# Patient Record
Sex: Male | Born: 1960 | Race: Black or African American | Hispanic: No | Marital: Married | Smoking: Never smoker
Health system: Southern US, Community
[De-identification: ages and names within clinical notes are randomized; demographics above are authoritative.]

## PROBLEM LIST (undated history)

## (undated) DIAGNOSIS — I10 Essential (primary) hypertension: Secondary | ICD-10-CM

## (undated) DIAGNOSIS — M869 Osteomyelitis, unspecified: Secondary | ICD-10-CM

## (undated) DIAGNOSIS — E119 Type 2 diabetes mellitus without complications: Secondary | ICD-10-CM

## (undated) DIAGNOSIS — R9431 Abnormal electrocardiogram [ECG] [EKG]: Secondary | ICD-10-CM

## (undated) DIAGNOSIS — E785 Hyperlipidemia, unspecified: Secondary | ICD-10-CM

## (undated) DIAGNOSIS — R011 Cardiac murmur, unspecified: Secondary | ICD-10-CM

## (undated) HISTORY — PX: MULTIPLE TOOTH EXTRACTIONS: SHX2053

## (undated) HISTORY — DX: Hyperlipidemia, unspecified: E78.5

## (undated) HISTORY — DX: Essential (primary) hypertension: I10

## (undated) HISTORY — DX: Abnormal electrocardiogram (ECG) (EKG): R94.31

---

## 1997-11-30 ENCOUNTER — Encounter: Admission: RE | Admit: 1997-11-30 | Discharge: 1998-02-28 | Payer: Self-pay | Admitting: *Deleted

## 2003-12-13 ENCOUNTER — Encounter: Admission: RE | Admit: 2003-12-13 | Discharge: 2004-01-11 | Payer: Self-pay | Admitting: Family Medicine

## 2004-01-23 ENCOUNTER — Ambulatory Visit: Payer: Self-pay | Admitting: Family Medicine

## 2004-03-04 DIAGNOSIS — R9431 Abnormal electrocardiogram [ECG] [EKG]: Secondary | ICD-10-CM

## 2004-03-04 HISTORY — DX: Abnormal electrocardiogram (ECG) (EKG): R94.31

## 2004-04-02 ENCOUNTER — Ambulatory Visit: Payer: Self-pay | Admitting: Family Medicine

## 2004-07-18 ENCOUNTER — Ambulatory Visit: Payer: Self-pay | Admitting: Internal Medicine

## 2004-07-25 ENCOUNTER — Ambulatory Visit: Payer: Self-pay

## 2004-08-07 ENCOUNTER — Ambulatory Visit: Payer: Self-pay | Admitting: Internal Medicine

## 2006-03-27 ENCOUNTER — Ambulatory Visit: Payer: Self-pay | Admitting: Internal Medicine

## 2006-03-27 LAB — CONVERTED CEMR LAB
ALT: 15 units/L (ref 0–40)
AST: 11 units/L (ref 0–37)
Albumin: 4 g/dL (ref 3.5–5.2)
Chloride: 96 meq/L (ref 96–112)
GFR calc Af Amer: 103 mL/min
Glucose, Bld: 289 mg/dL — ABNORMAL HIGH (ref 70–99)
MCV: 83.6 fL (ref 78.0–100.0)
Microalb Creat Ratio: 8.9 mg/g (ref 0.0–30.0)
Microalb, Ur: 2.2 mg/dL — ABNORMAL HIGH (ref 0.0–1.9)
Potassium: 3.3 meq/L — ABNORMAL LOW (ref 3.5–5.1)
RBC: 6 M/uL — ABNORMAL HIGH (ref 4.22–5.81)
RDW: 11.8 % (ref 11.5–14.6)
Sodium: 136 meq/L (ref 135–145)
TSH: 3.69 microintl units/mL (ref 0.35–5.50)
Total Bilirubin: 1.8 mg/dL — ABNORMAL HIGH (ref 0.3–1.2)
WBC: 3.8 10*3/uL — ABNORMAL LOW (ref 4.5–10.5)

## 2006-04-24 ENCOUNTER — Ambulatory Visit: Payer: Self-pay | Admitting: Internal Medicine

## 2006-04-24 LAB — CONVERTED CEMR LAB
ALT: 23 units/L (ref 0–40)
AST: 18 units/L (ref 0–37)
Creatinine, Ser: 1 mg/dL (ref 0.4–1.5)

## 2006-07-23 DIAGNOSIS — E1159 Type 2 diabetes mellitus with other circulatory complications: Secondary | ICD-10-CM

## 2006-07-23 DIAGNOSIS — I10 Essential (primary) hypertension: Secondary | ICD-10-CM

## 2006-07-29 ENCOUNTER — Encounter (INDEPENDENT_AMBULATORY_CARE_PROVIDER_SITE_OTHER): Payer: Self-pay | Admitting: *Deleted

## 2006-09-01 ENCOUNTER — Encounter (INDEPENDENT_AMBULATORY_CARE_PROVIDER_SITE_OTHER): Payer: Self-pay | Admitting: *Deleted

## 2006-10-23 ENCOUNTER — Ambulatory Visit: Payer: Self-pay | Admitting: Internal Medicine

## 2006-10-23 DIAGNOSIS — R9431 Abnormal electrocardiogram [ECG] [EKG]: Secondary | ICD-10-CM

## 2006-10-23 DIAGNOSIS — E118 Type 2 diabetes mellitus with unspecified complications: Secondary | ICD-10-CM

## 2006-10-23 DIAGNOSIS — E785 Hyperlipidemia, unspecified: Secondary | ICD-10-CM

## 2006-10-23 DIAGNOSIS — E1169 Type 2 diabetes mellitus with other specified complication: Secondary | ICD-10-CM

## 2006-10-24 LAB — CONVERTED CEMR LAB
AST: 13 units/L (ref 0–37)
Chloride: 98 meq/L (ref 96–112)
Creatinine, Ser: 0.9 mg/dL (ref 0.4–1.5)
Glucose, Bld: 283 mg/dL — ABNORMAL HIGH (ref 70–99)
Potassium: 4.1 meq/L (ref 3.5–5.1)
Sodium: 139 meq/L (ref 135–145)

## 2006-11-09 ENCOUNTER — Encounter: Payer: Self-pay | Admitting: Internal Medicine

## 2006-11-11 ENCOUNTER — Encounter: Admission: RE | Admit: 2006-11-11 | Discharge: 2006-11-11 | Payer: Self-pay | Admitting: Internal Medicine

## 2006-11-11 ENCOUNTER — Encounter: Payer: Self-pay | Admitting: Internal Medicine

## 2007-02-24 ENCOUNTER — Ambulatory Visit: Payer: Self-pay | Admitting: Internal Medicine

## 2007-03-03 LAB — CONVERTED CEMR LAB
Cholesterol: 117 mg/dL (ref 0–200)
HDL: 37.6 mg/dL — ABNORMAL LOW (ref >39.0)
LDL Cholesterol: 49 mg/dL (ref 0–99)
Total CHOL/HDL Ratio: 3.1
VLDL: 30 mg/dL (ref 0–40)

## 2007-05-21 ENCOUNTER — Telehealth (INDEPENDENT_AMBULATORY_CARE_PROVIDER_SITE_OTHER): Payer: Self-pay | Admitting: *Deleted

## 2007-06-19 ENCOUNTER — Encounter (INDEPENDENT_AMBULATORY_CARE_PROVIDER_SITE_OTHER): Payer: Self-pay | Admitting: *Deleted

## 2007-07-20 ENCOUNTER — Ambulatory Visit: Payer: Self-pay | Admitting: Internal Medicine

## 2007-07-24 ENCOUNTER — Telehealth (INDEPENDENT_AMBULATORY_CARE_PROVIDER_SITE_OTHER): Payer: Self-pay | Admitting: *Deleted

## 2007-07-24 LAB — CONVERTED CEMR LAB
BUN: 14 mg/dL (ref 6–23)
CO2: 33 meq/L — ABNORMAL HIGH (ref 19–32)
Chloride: 99 meq/L (ref 96–112)
Creatinine, Ser: 1 mg/dL (ref 0.4–1.5)
Glucose, Bld: 234 mg/dL — ABNORMAL HIGH (ref 70–99)
Hgb A1c MFr Bld: 11.7 % — ABNORMAL HIGH (ref 4.6–6.0)

## 2007-08-27 ENCOUNTER — Telehealth (INDEPENDENT_AMBULATORY_CARE_PROVIDER_SITE_OTHER): Payer: Self-pay | Admitting: *Deleted

## 2007-08-28 ENCOUNTER — Encounter (INDEPENDENT_AMBULATORY_CARE_PROVIDER_SITE_OTHER): Payer: Self-pay | Admitting: *Deleted

## 2007-09-24 ENCOUNTER — Telehealth (INDEPENDENT_AMBULATORY_CARE_PROVIDER_SITE_OTHER): Payer: Self-pay | Admitting: *Deleted

## 2007-09-29 ENCOUNTER — Encounter (INDEPENDENT_AMBULATORY_CARE_PROVIDER_SITE_OTHER): Payer: Self-pay | Admitting: *Deleted

## 2007-09-30 ENCOUNTER — Telehealth (INDEPENDENT_AMBULATORY_CARE_PROVIDER_SITE_OTHER): Payer: Self-pay | Admitting: *Deleted

## 2007-10-19 ENCOUNTER — Ambulatory Visit: Payer: Self-pay | Admitting: Internal Medicine

## 2007-10-21 LAB — CONVERTED CEMR LAB: Potassium: 3.6 meq/L (ref 3.5–5.1)

## 2007-11-05 ENCOUNTER — Ambulatory Visit: Payer: Self-pay | Admitting: Internal Medicine

## 2007-11-05 DIAGNOSIS — F528 Other sexual dysfunction not due to a substance or known physiological condition: Secondary | ICD-10-CM

## 2007-11-13 ENCOUNTER — Telehealth (INDEPENDENT_AMBULATORY_CARE_PROVIDER_SITE_OTHER): Payer: Self-pay | Admitting: *Deleted

## 2007-11-16 ENCOUNTER — Telehealth (INDEPENDENT_AMBULATORY_CARE_PROVIDER_SITE_OTHER): Payer: Self-pay | Admitting: *Deleted

## 2007-12-17 ENCOUNTER — Encounter (INDEPENDENT_AMBULATORY_CARE_PROVIDER_SITE_OTHER): Payer: Self-pay | Admitting: *Deleted

## 2008-04-15 ENCOUNTER — Ambulatory Visit: Payer: Self-pay | Admitting: Internal Medicine

## 2008-08-30 ENCOUNTER — Encounter (INDEPENDENT_AMBULATORY_CARE_PROVIDER_SITE_OTHER): Payer: Self-pay | Admitting: *Deleted

## 2009-02-22 ENCOUNTER — Ambulatory Visit: Payer: Self-pay | Admitting: Internal Medicine

## 2009-02-22 LAB — HM DIABETES FOOT EXAM

## 2009-02-23 ENCOUNTER — Encounter (INDEPENDENT_AMBULATORY_CARE_PROVIDER_SITE_OTHER): Payer: Self-pay | Admitting: *Deleted

## 2009-02-23 LAB — CONVERTED CEMR LAB
AST: 17 units/L (ref 0–37)
Basophils Relative: 0.4 % (ref 0.0–3.0)
Calcium: 9.6 mg/dL (ref 8.4–10.5)
GFR calc non Af Amer: 101.56 mL/min (ref >60)
HDL: 42.7 mg/dL (ref >39.00)
Hemoglobin: 15.8 g/dL (ref 13.0–17.0)
Hgb A1c MFr Bld: 9.7 % — ABNORMAL HIGH (ref 4.6–6.5)
Lymphocytes Relative: 47.3 % — ABNORMAL HIGH (ref 12.0–46.0)
Microalb Creat Ratio: 9.8 mg/g (ref 0.0–30.0)
Monocytes Relative: 10.4 % (ref 3.0–12.0)
Neutro Abs: 1.5 10*3/uL (ref 1.4–7.7)
RBC: 5.5 M/uL (ref 4.22–5.81)
Sodium: 140 meq/L (ref 135–145)
TSH: 3.87 microintl units/mL (ref 0.35–5.50)
Total CHOL/HDL Ratio: 3
VLDL: 24.2 mg/dL (ref 0.0–40.0)

## 2009-03-02 ENCOUNTER — Ambulatory Visit: Payer: Self-pay | Admitting: Endocrinology

## 2009-07-21 ENCOUNTER — Ambulatory Visit: Payer: Self-pay | Admitting: Internal Medicine

## 2009-07-21 DIAGNOSIS — F419 Anxiety disorder, unspecified: Secondary | ICD-10-CM

## 2009-07-27 ENCOUNTER — Ambulatory Visit: Payer: Self-pay | Admitting: Endocrinology

## 2010-01-16 ENCOUNTER — Telehealth: Payer: Self-pay | Admitting: Endocrinology

## 2010-01-31 ENCOUNTER — Ambulatory Visit: Payer: Self-pay | Admitting: Endocrinology

## 2010-01-31 LAB — CONVERTED CEMR LAB: Hgb A1c MFr Bld: 11.9 % — ABNORMAL HIGH (ref 4.6–6.5)

## 2010-04-01 LAB — CONVERTED CEMR LAB
Basophils Absolute: 0 10*3/uL (ref 0.0–0.1)
Basophils Relative: 1 % (ref 0.0–3.0)
Calcium: 9.4 mg/dL (ref 8.4–10.5)
Chloride: 101 meq/L (ref 96–112)
Cholesterol: 90 mg/dL (ref 0–200)
Creatinine, Ser: 0.8 mg/dL (ref 0.4–1.5)
Creatinine,U: 228.6 mg/dL
Eosinophils Absolute: 0.1 10*3/uL (ref 0.0–0.7)
GFR calc non Af Amer: 109 mL/min
HDL: 32.1 mg/dL — ABNORMAL LOW (ref >39.0)
Hgb A1c MFr Bld: 10.1 % — ABNORMAL HIGH (ref 4.6–6.0)
MCHC: 34.8 g/dL (ref 30.0–36.0)
MCV: 83.9 fL (ref 78.0–100.0)
Neutrophils Relative %: 48.9 % (ref 43.0–77.0)
RBC: 5.29 M/uL (ref 4.22–5.81)
RDW: 11.9 % (ref 11.5–14.6)
TSH: 3.19 microintl units/mL (ref 0.35–5.50)
Triglycerides: 78 mg/dL (ref 0–149)
VLDL: 16 mg/dL (ref 0–40)

## 2010-04-03 NOTE — Assessment & Plan Note (Signed)
Summary: FU PER PT D/T---STC   Vital Signs:  Patient profile:   50 year old male Height:      71.5 inches (181.61 cm) Weight:      247.13 pounds (112.33 kg) O2 Sat:      97 % on Room air Temp:     98.6 degrees F (37.00 degrees C) oral Pulse rate:   80 / minute BP sitting:   142 / 82  (left arm) Cuff size:   large  Vitals Entered By: Josph Macho RMA (Jul 27, 2009 8:19 AM)  O2 Flow:  Room air CC: Follow-up visit/ CF Is Patient Diabetic? Yes   CC:  Follow-up visit/ CF.  History of Present Illness: pt says his elevated cbg's are being driven by "stress."  no cbg record, but states cbg's are 100's-200's.  it is in general higher as the day goes on.  he does not want to start insulin.  Current Medications (verified): 1)  Hydrochlorothiazide 25 Mg Tabs (Hydrochlorothiazide) .... Take 1 Tablet By Mouth Every Morning - Due Office Visit in April 2)  Glipizide 10 Mg Tb24 (Glipizide) .... Take 1 Tablet By Mouth Two Times A Day 3)  Vytorin 10-40 Mg Tabs (Ezetimibe-Simvastatin) .... Take 1 Tablet By Mouth Once A Day 4)  Metformin Hcl 1000 Mg Tabs (Metformin Hcl) .Marland Kitchen.. 1 1/2 By Mouth Bid 5)  Actos 45 Mg  Tabs (Pioglitazone Hcl) .Marland Kitchen.. 1 By Mouth Qd 6)  Klor-Con 10 10 Meq  Tbcr (Potassium Chloride) .Marland Kitchen.. 1 By Mouth Qd 7)  Aspirin 81 Mg Tbec (Aspirin) .Marland Kitchen.. 1 A Day 8)  Vitamin D 9)  Onetouch Ultra Test  Strp (Glucose Blood) .... Two Times A Day, and Lancets 250.02  Allergies (verified): No Known Drug Allergies  Past History:  Past Medical History: Last updated: 07/21/2009 Hypertension Diabetes mellitus, type II Hyperlipidemia 2006  abnormal EKG ( [facility])  w/ a neg stress test    Review of Systems  The patient denies hypoglycemia.    Physical Exam  General:  normal appearance.   Pulses:  dorsalis pedis intact bilat.    Extremities:  no deformity.  no ulcer on the feet.  feet are of normal color and temp.  skin on the feet is very dry. trace right pedal edema and trace left pedal  edema.   mycotic toenails.   Neurologic:  sensation is intact to touch on the feet  Additional Exam:  Hemoglobin A1C       [H]  10.6 %                      4.6-6.5 Testosterone              495.52 ng/dL     Impression & Recommendations:  Problem # 1:  DIABETES MELLITUS, TYPE II (ICD-250.00) needs increased rx  Problem # 2:  ERECTILE DYSFUNCTION (ICD-302.72) with normal testosterone  Other Orders: TLB-A1C / Hgb A1C (Glycohemoglobin) (83036-A1C) TLB-Testosterone, Total (84403-TESTO) Est. Patient Level III (86578)  Patient Instructions: 1)  check your blood sugar 2 times a day.  vary the time of day when you check, between before the 3 meals, and at bedtime.  also check if you have symptoms of your blood sugar being too high or too low.  please keep a record of the readings and bring it to your next appointment here.  please call us sooner if you are having low blood sugar episodes. 2)  blood tests are being ordered for you  today.  please call [phone] to hear your test results. 3)  pending the test results, please consider that there are several other diabetes pills which could be added to help your blood sugar, but it is unlikely that your blood sugar can be controlled unless you take insulin. 4)  Please schedule a follow-up appointment in 3 months. 5)  (update: i left message on phone-tree:  rx as we discussed)

## 2010-04-03 NOTE — Assessment & Plan Note (Signed)
Summary: 4 mth fu/ns/kdc   Vital Signs:  Patient profile:   50 year old male Height:      71.5 inches Weight:      248.2 pounds BMI:     34.26 Pulse rate:   70 / minute BP sitting:   128 / 80  Vitals Entered By: Shary Decamp (Jul 21, 2009 4:19 PM) CC: pt states he has been compliant with his meds but blood sugar still elevated. feels like it is related to stress   History of Present Illness: routine office visit  Current Medications (verified): 1)  Hydrochlorothiazide 25 Mg Tabs (Hydrochlorothiazide) .... Take 1 Tablet By Mouth Every Morning - Due Office Visit in April 2)  Glipizide 10 Mg Tb24 (Glipizide) .... Take 1 Tablet By Mouth Two Times A Day 3)  Vytorin 10-40 Mg Tabs (Ezetimibe-Simvastatin) .... Take 1 Tablet By Mouth Once A Day 4)  Metformin Hcl 1000 Mg Tabs (Metformin Hcl) .Marland Kitchen.. 1 1/2 By Mouth Bid 5)  Actos 45 Mg  Tabs (Pioglitazone Hcl) .Marland Kitchen.. 1 By Mouth Qd 6)  Klor-Con 10 10 Meq  Tbcr (Potassium Chloride) .Marland Kitchen.. 1 By Mouth Qd 7)  Aspirin 81 Mg Tbec (Aspirin) .Marland Kitchen.. 1 A Day 8)  Vitamin D 9)  Onetouch Ultra Test  Strp (Glucose Blood) .... Two Times A Day, and Lancets 250.02  Allergies (verified): No Known Drug Allergies  Past History:  Past Medical History: Hypertension Diabetes mellitus, type II Hyperlipidemia 2006  abnormal EKG ( [facility])  w/ a neg stress test    Past Surgical History: Reviewed history from 11/05/2007 and no changes required. no  Social History: Reviewed history from 02/22/2009 and no changes required. 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  Physical Exam  General:  alert and well-developed.   Psych:  Oriented X3, memory intact for recent and remote, normally interactive, good eye contact, not anxious appearing, and not depressed appearing.     Impression & Recommendations:  Problem # 1:  ANXIETY (ICD-300.00)  we spent more than 50% of the time in this 20 minute visit talking about his anxiety. His symptoms are probably better described as frustration in regards to his home finances. The patient was counseled I agree to refer him to marriage counseling  Orders: Misc. Referral (Misc. Ref)  Problem # 2:  DIABETES MELLITUS, TYPE II (ICD-250.00) per Dr. Everardo All, to see him in few days His updated medication list for this problem includes:    Glipizide 10 Mg Tb24 (Glipizide) .Marland Kitchen... Take 1 tablet by mouth two times a day    Metformin Hcl 1000 Mg Tabs (Metformin hcl) .Marland Kitchen... 1 1/2 by mouth bid    Actos 45 Mg Tabs (Pioglitazone hcl) .Marland Kitchen... 1 by mouth qd    Aspirin 81 Mg Tbec (Aspirin) .Marland Kitchen... 1 a day  Problem # 3:  HYPERTENSION (ICD-401.9) well-controlled His updated medication list for this problem includes:    Hydrochlorothiazide 25 Mg Tabs (Hydrochlorothiazide) .Marland Kitchen... Take 1 tablet by mouth every morning - due office visit  in april  BP today: 128/80 Prior BP: 137/60 (03/02/2009)  Labs Reviewed: K+: 4.2 (02/22/2009) Creat: : 1.0 (02/22/2009)   Chol: 112 (02/22/2009)   HDL: 42.70 (02/22/2009)   LDL: 45 (02/22/2009)   TG: 121.0 (02/22/2009)  Complete Medication List: 1)  Hydrochlorothiazide 25 Mg Tabs (Hydrochlorothiazide) .... Take 1 tablet by mouth every morning - due office visit in april 2)  Glipizide 10 Mg Tb24 (Glipizide) .... Take 1 tablet by mouth two times a day 3)  Vytorin 10-40 Mg Tabs (Ezetimibe-simvastatin) .... Take 1 tablet by mouth once a day 4)  Metformin Hcl 1000 Mg Tabs (Metformin hcl) .Marland Kitchen.. 1 1/2 by mouth  bid 5)  Actos 45 Mg Tabs (Pioglitazone hcl) .Marland Kitchen.. 1 by mouth qd 6)  Klor-con 10 10 Meq Tbcr (Potassium chloride) .Marland Kitchen.. 1 by mouth qd 7)  Aspirin 81 Mg Tbec (Aspirin) .Marland Kitchen.. 1 a day 8)  Vitamin D  9)  Onetouch Ultra Test Strp (Glucose blood) .... Two times a day, and lancets 250.02  Patient Instructions: 1)  Please schedule a follow-up appointment in 6 months .

## 2010-04-03 NOTE — Progress Notes (Signed)
Summary: OV due  Phone Note Outgoing Call Call back at [facility] Phone [phone] Call back at Work Phone [phone]   Call placed by: Brenton Grills CMA Duncan Dull),  January 16, 2010 1:43 PM Call placed to: Patient Summary of Call: Per MD, pt is due for F/U OV. Left message for pt to callback office  Follow-up for Phone Call        left message for pt to callback office Follow-up by: Brenton Grills CMA Duncan Dull),  January 18, 2010 11:27 AM  Additional Follow-up for Phone Call Additional follow up Details #1::        Appointment scheduled 01/31/2010 8:00am Additional Follow-up by: Brenton Grills CMA Duncan Dull),  January 22, 2010 4:24 PM

## 2010-04-03 NOTE — Assessment & Plan Note (Signed)
Summary: PER [facility] FU--STC   Vital Signs:  Patient profile:   50 year old male Height:      71.5 inches (181.61 cm) Weight:      246.25 pounds (111.93 kg) BMI:     33.99 O2 Sat:      97 % on Room air Temp:     98.8 degrees F (37.11 degrees C) oral Pulse rate:   71 / minute BP sitting:   152 / 82  (left arm) Cuff size:   large  Vitals Entered By: Brenton Grills CMA Duncan Dull) (January 31, 2010 8:11 AM)  O2 Flow:  Room air CC: Follow-up visit/aj Is Patient Diabetic? Yes   CC:  Follow-up visit/aj.  History of Present Illness: pt states he feels well in general, except for "family issues."  no cbg record, but states cbg's are 200's.  he now agrees to insulin   Current Medications (verified): 1)  Hydrochlorothiazide 25 Mg Tabs (Hydrochlorothiazide) .... Take 1 Tablet By Mouth Every Morning - Due Office Visit in April 2)  Glipizide 10 Mg Tb24 (Glipizide) .... Take 1 Tablet By Mouth Two Times A Day 3)  Vytorin 10-40 Mg Tabs (Ezetimibe-Simvastatin) .... Take 1 Tablet By Mouth Once A Day 4)  Metformin Hcl 1000 Mg Tabs (Metformin Hcl) .Marland Kitchen.. 1 1/2 By Mouth Bid 5)  Actos 45 Mg  Tabs (Pioglitazone Hcl) .Marland Kitchen.. 1 By Mouth Qd 6)  Klor-Con 10 10 Meq  Tbcr (Potassium Chloride) .Marland Kitchen.. 1 By Mouth Qd 7)  Aspirin 81 Mg Tbec (Aspirin) .Marland Kitchen.. 1 A Day 8)  Vitamin D 9)  Onetouch Ultra Test  Strp (Glucose Blood) .... 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    Physical Exam  General:  normal appearance.   Pulses:   dorsalis pedis intact bilat.    Extremities:  no deformity.  no ulcer on the feet.  feet are of normal color and temp.  there are mycotic toenails.   Neurologic:  sensation is intact to touch on the feet    Impression & Recommendations:  Problem # 1:  DIABETES MELLITUS, TYPE II (ICD-250.00) poorcontrol needs insulin  HgbA1C: 10.6 (07/27/2009)  Medications Added to Medication List This Visit: 1)  Levemir Flexpen 100 Unit/ml Soln (Insulin detemir) .Marland Kitchen.. 10 units each am, and pen needles once daily  Other Orders: Diabetic Clinic Referral (Diabetic) TLB-A1C / Hgb A1C (Glycohemoglobin) (83036-A1C) Est. Patient Level III (09323)  Patient Instructions: 1)  check your blood sugar 2 times a day.  vary the time of day when you check, between before the 3 meals, and at bedtime.  also check if you have symptoms of your blood sugar being too high or too low.  please keep a record of the readings and bring it to your next appointment here.  please call us sooner if you are having low blood sugar episodes. 2)  blood tests are being ordered for you today.  please call [phone] to hear your test results. 3)  Please schedule a follow-up appointment in 3 months. 4)  refer to a  diabetes educator to learn insulin pen. 5)  start levemir 10 units each am.  then increase by 5 units, every few days, until blood sugar at some time of day is in the low-100's. 6)  continue diabetes pills for now. 7)  return early january. call sooner if any questions. 8)  (update: i left message on phone-tree:  rx as we discussed) Prescriptions: LEVEMIR FLEXPEN 100 UNIT/ML SOLN (INSULIN DETEMIR) 10 units each am, and pen needles once daily  #1 box x 11   Entered and Authorized by:   Minus Breeding MD   Signed by:   Minus Breeding MD on 01/31/2010   Method used:   Electronically to        [facility] [phone]* (retail)       [address]       [place], [place]  [address]       Ph: [phone]       Fax: [phone]    RxID:   [id number]    Orders Added: 1)  Diabetic Clinic Referral [Diabetic] 2)  TLB-A1C / Hgb A1C (Glycohemoglobin) [83036-A1C] 3)  Est. Patient Level III [72536]

## 2010-05-22 ENCOUNTER — Other Ambulatory Visit: Payer: Self-pay | Admitting: Internal Medicine

## 2010-05-22 DIAGNOSIS — E119 Type 2 diabetes mellitus without complications: Secondary | ICD-10-CM

## 2010-05-22 MED ORDER — GLIPIZIDE 10 MG PO TABS: 10.0000 mg | ORAL_TABLET | Freq: Two times a day (BID) | ORAL | Status: DC

## 2010-05-22 NOTE — Telephone Encounter (Signed)
Rx forwarded from [facility] for diabetic medication

## 2010-06-28 ENCOUNTER — Other Ambulatory Visit: Payer: Self-pay | Admitting: Internal Medicine

## 2010-08-05 ENCOUNTER — Other Ambulatory Visit: Payer: Self-pay | Admitting: Endocrinology

## 2010-08-06 ENCOUNTER — Other Ambulatory Visit: Payer: Self-pay | Admitting: Internal Medicine

## 2010-09-13 ENCOUNTER — Other Ambulatory Visit: Payer: Self-pay | Admitting: Internal Medicine

## 2010-09-13 NOTE — Telephone Encounter (Signed)
Patient has appt scheduled for Mon 7/16 at 11:30

## 2010-09-17 ENCOUNTER — Ambulatory Visit (INDEPENDENT_AMBULATORY_CARE_PROVIDER_SITE_OTHER): Payer: BC Managed Care – PPO | Admitting: Internal Medicine

## 2010-09-17 ENCOUNTER — Encounter: Payer: Self-pay | Admitting: Internal Medicine

## 2010-09-17 DIAGNOSIS — I10 Essential (primary) hypertension: Secondary | ICD-10-CM

## 2010-09-17 DIAGNOSIS — E119 Type 2 diabetes mellitus without complications: Secondary | ICD-10-CM

## 2010-09-17 DIAGNOSIS — E785 Hyperlipidemia, unspecified: Secondary | ICD-10-CM

## 2010-09-17 NOTE — Patient Instructions (Addendum)
Please came back for labs within a week, fasting FLP---dx hyperlipidemia BMP---dx HTN You need to see Dr Everardo All ASAP for your diabetes

## 2010-09-17 NOTE — Assessment & Plan Note (Addendum)
Poor compliance, strongly rec to see Dr Everardo All. Risk of poorly controlled DM discussed, we went over the meaning of a hemoglobin A1c He has lost wt: praised (hopefully wt loss due to a better lifestyle and not due to poorly controlled diabetes)

## 2010-09-17 NOTE — Assessment & Plan Note (Signed)
Due for labs , See instructions

## 2010-09-17 NOTE — Assessment & Plan Note (Signed)
At goal , labs, restart K+ supplements if needed (stopped taking K+ a while back

## 2010-09-17 NOTE — Progress Notes (Signed)
  Subjective:    Patient ID: Troy Becker, male    DOB: 1960/08/28, 50 y.o.   MRN: [id number]  HPI ROV, last OV > 1 year ago. Feels great physically, reports he still has a lot of anxiety ( patient states related to his relationship with wife)  Past Medical History  Diagnosis Date  . Hypertension   . Diabetes mellitus   . Hyperlipidemia   . Abnormal EKG 2006    w/ a neg stress test   No past surgical history on file.   Review of Systems As far as the diabetes, has not seen Dr. Everardo All in a while, he self discontinued Actos due to the fear of bladder cancer, he was prescribed insulin but never did try. Reports that he has improved quite a bit his diet and is trying to exercise more. Denies polydipsia or polyuria. His weight has dropped from 248 to 239. Sleeps well despite anxiety. Not taking potassium, reason?     Objective:   Physical Exam  Constitutional: He appears well-developed and well-nourished. No distress.  HENT:  Head: Normocephalic and atraumatic.  Cardiovascular: Normal rate, regular rhythm and normal heart sounds.   No murmur heard. Pulmonary/Chest: Breath sounds normal. No respiratory distress. He has no wheezes. He has no rales.  Musculoskeletal: He exhibits no edema.  Skin: He is not diaphoretic.          Assessment & Plan:

## 2010-09-21 ENCOUNTER — Other Ambulatory Visit: Payer: Self-pay | Admitting: Internal Medicine

## 2010-09-21 DIAGNOSIS — I10 Essential (primary) hypertension: Secondary | ICD-10-CM

## 2010-09-21 DIAGNOSIS — E785 Hyperlipidemia, unspecified: Secondary | ICD-10-CM

## 2010-09-24 ENCOUNTER — Other Ambulatory Visit (INDEPENDENT_AMBULATORY_CARE_PROVIDER_SITE_OTHER): Payer: BC Managed Care – PPO

## 2010-09-24 DIAGNOSIS — I10 Essential (primary) hypertension: Secondary | ICD-10-CM

## 2010-09-24 DIAGNOSIS — E785 Hyperlipidemia, unspecified: Secondary | ICD-10-CM

## 2010-09-24 LAB — BASIC METABOLIC PANEL
BUN: 15 mg/dL (ref 6–23)
Calcium: 9.2 mg/dL (ref 8.4–10.5)
GFR: 123.42 mL/min (ref >60.00)
Glucose, Bld: 165 mg/dL — ABNORMAL HIGH (ref 70–99)

## 2010-09-24 LAB — LIPID PANEL
Cholesterol: 90 mg/dL (ref 0–200)
HDL: 43.1 mg/dL (ref >39.00)
LDL Cholesterol: 31 mg/dL (ref 0–99)
VLDL: 15.8 mg/dL (ref 0.0–40.0)

## 2010-09-24 NOTE — Progress Notes (Signed)
Labs only

## 2010-10-01 ENCOUNTER — Other Ambulatory Visit: Payer: Self-pay | Admitting: Internal Medicine

## 2010-10-19 ENCOUNTER — Other Ambulatory Visit: Payer: Self-pay | Admitting: Endocrinology

## 2010-12-11 ENCOUNTER — Other Ambulatory Visit: Payer: Self-pay | Admitting: Endocrinology

## 2011-03-31 ENCOUNTER — Other Ambulatory Visit: Payer: Self-pay | Admitting: Endocrinology

## 2011-05-10 ENCOUNTER — Other Ambulatory Visit: Payer: Self-pay | Admitting: Internal Medicine

## 2011-05-13 NOTE — Telephone Encounter (Signed)
Prescriptions sent to pharmacy

## 2011-07-17 ENCOUNTER — Other Ambulatory Visit: Payer: Self-pay | Admitting: Endocrinology

## 2011-07-22 ENCOUNTER — Other Ambulatory Visit: Payer: Self-pay | Admitting: *Deleted

## 2011-07-22 MED ORDER — GLIPIZIDE 10 MG PO TABS: ORAL_TABLET | ORAL | Status: DC

## 2011-07-22 MED ORDER — METFORMIN HCL 1000 MG PO TABS: ORAL_TABLET | ORAL | Status: DC

## 2011-07-22 NOTE — Telephone Encounter (Signed)
Pt has scheduled an appointment for June 3rd. He needs refills of his medication until then. Rx for Metformin and Glipizide sent to Pharmacy, pt informed.

## 2011-08-05 ENCOUNTER — Encounter: Payer: Self-pay | Admitting: Endocrinology

## 2011-08-05 ENCOUNTER — Ambulatory Visit (INDEPENDENT_AMBULATORY_CARE_PROVIDER_SITE_OTHER): Payer: BC Managed Care – PPO | Admitting: Endocrinology

## 2011-08-05 ENCOUNTER — Other Ambulatory Visit (INDEPENDENT_AMBULATORY_CARE_PROVIDER_SITE_OTHER): Payer: BC Managed Care – PPO

## 2011-08-05 VITALS — BP 142/82 | HR 76 | Temp 98.1°F | Ht 72.0 in | Wt 239.0 lb

## 2011-08-05 DIAGNOSIS — E119 Type 2 diabetes mellitus without complications: Secondary | ICD-10-CM

## 2011-08-05 DIAGNOSIS — Z79899 Other long term (current) drug therapy: Secondary | ICD-10-CM

## 2011-08-05 LAB — HEPATIC FUNCTION PANEL
ALT: 15 U/L (ref 0–53)
AST: 18 U/L (ref 0–37)
Albumin: 3.8 g/dL (ref 3.5–5.2)
Alkaline Phosphatase: 67 U/L (ref 39–117)
Total Bilirubin: 1 mg/dL (ref 0.3–1.2)

## 2011-08-05 LAB — HEMOGLOBIN A1C: Hgb A1c MFr Bld: 12.8 % — ABNORMAL HIGH (ref 4.6–6.5)

## 2011-08-05 LAB — MICROALBUMIN / CREATININE URINE RATIO: Microalb Creat Ratio: 3.7 mg/g (ref 0.0–30.0)

## 2011-08-05 MED ORDER — INSULIN ASPART PROT & ASPART (70-30 MIX) 100 UNIT/ML ~~LOC~~ SUSP: 10.0000 [IU] | Freq: Every day | SUBCUTANEOUS | Status: DC

## 2011-08-05 MED ORDER — TERBINAFINE HCL 250 MG PO TABS: 250.0000 mg | ORAL_TABLET | Freq: Every day | ORAL | Status: DC

## 2011-08-05 MED ORDER — GLUCOSE BLOOD VI STRP: 1.0000 | ORAL_STRIP | Freq: Every day | Status: DC

## 2011-08-05 NOTE — Progress Notes (Signed)
Subjective:    Patient ID: Troy Becker, male    DOB: 02/07/1961, 51 y.o.   MRN: [id number]  HPI Pt returns for f/u of type 2 DM (dx'ed 1999; no known complications).  pt states he feels well in general.  no cbg record, but states cbg's are in the 200's.    Pt states few years of slight thickening of the toenails, but no assoc pain. Past Medical History  Diagnosis Date  . Hypertension   . Diabetes mellitus   . Hyperlipidemia   . Abnormal EKG 2006    w/ a neg stress test    No past surgical history on file.  History   Social History  . Marital Status: Married    Spouse Name: N/A    Number of Children: N/A  . Years of Education: N/A   Occupational History  . Project Management (Customer service manager) sedentary     Social History Main Topics  . Smoking status: Never Smoker   . Smokeless tobacco: Not on file  . Alcohol Use: No  . Drug Use: Not on file  . Sexually Active: Not on file   Other Topics Concern  . Not on file   Social History Narrative   Exercise- was walking some until the weather turned cold Diet- have cut down on sugar and salt     Current Outpatient Prescriptions on File Prior to Visit  Medication Sig Dispense Refill  . aspirin 81 MG tablet Take 81 mg by mouth daily.        Marland Kitchen glipiZIDE (GLUCOTROL) 10 MG tablet TAKE 1 TABLET BY MOUTH TWICE DAILY  60 tablet  1  . hydrochlorothiazide (HYDRODIURIL) 25 MG tablet TAKE 1 TABLET BY MOUTH EVERY MORNING  30 tablet  4  . metFORMIN (GLUCOPHAGE) 1000 MG tablet TAKE 1 1/2 TABLET BY MOUTH TWICE DAILY  90 tablet  1  . VYTORIN 10-40 MG per tablet TAKE 1 TABLET BY MOUTH EVERY DAY  30 tablet  4  . insulin aspart protamine-insulin aspart (NOVOLOG MIX 70/30 FLEXPEN) (70-30) 100 UNIT/ML injection Inject 10 Units into the skin daily with breakfast. And pen needles 1/day  15 mL  12    No Known Allergies  Family History  Problem Relation Age of Onset  . Coronary artery disease Father     CABG 75  . Hypertension Father     . Hypertension Mother   . Hypertension Sister   . Hypertension Brother   . Colon cancer Neg Hx   . Prostate cancer Neg Hx   . Diabetes Brother     BP 142/82  Pulse 76  Temp(Src) 98.1 F (36.7 C) (Oral)  Ht 6' (1.829 m)  Wt 239 lb (108.41 kg)  BMI 32.41 kg/m2  SpO2 98%    Review of Systems He has lost a few lb, due to his efforts.  denies hypoglycemia    Objective:   Physical Exam VITAL SIGNS:  See vs page GENERAL: no distress Pulses: dorsalis pedis intact bilat.   Feet: no deformity.  no ulcer on the feet.  feet are of normal color and temp.  no edema.  There is bilateral onychomycosis. Neuro: sensation is intact to touch on the feet.  Lab Results  Component Value Date   HGBA1C 12.8* 08/05/2011      Assessment & Plan:  DM.  therapy limited by noncompliance.  However, he agrees to start insulin now.  He wants the novolog 70/30 flexpen. Onychomycosis of toenails HTN, with ? Of  situational component

## 2011-08-05 NOTE — Patient Instructions (Addendum)
blood tests are being requested for you today.  You will receive a letter with results. check your blood sugar once a day.  vary the time of day when you check, between before the 3 meals, and at bedtime.  also check if you have symptoms of your blood sugar being too high or too low.  please keep a record of the readings and bring it to your next appointment here.  please call us sooner if your blood sugar goes below 70, or if it stays over 200. good diet and exercise habits significanly improve the control of your diabetes.  please let me know if you wish to be referred to a dietician.  high blood sugar is very risky to your health.  you should see an eye doctor every year. controlling your blood pressure and cholesterol drastically reduces the damage diabetes does to your body.  this also applies to quitting smoking.  please discuss these with your doctor.  you should take an aspirin every day, unless you have been advised by a doctor not to. Based on the blood tests, i'll prescribe for you the novolog 70/30, flexpen.   Please come back for a follow-up appointment for 1 month. i have sent a prescription to your pharmacy, for the toenail fungus.

## 2011-08-06 ENCOUNTER — Telehealth: Payer: Self-pay | Admitting: *Deleted

## 2011-08-06 NOTE — Telephone Encounter (Signed)
Called pt to inform of lab results, left message for pt to callback office (letter also mailed to pt). 

## 2011-08-07 ENCOUNTER — Ambulatory Visit (INDEPENDENT_AMBULATORY_CARE_PROVIDER_SITE_OTHER): Payer: BC Managed Care – PPO | Admitting: Internal Medicine

## 2011-08-07 VITALS — BP 128/76 | HR 84 | Temp 97.8°F | Ht 72.0 in | Wt 241.0 lb

## 2011-08-07 DIAGNOSIS — Z0000 Encounter for general adult medical examination without abnormal findings: Secondary | ICD-10-CM

## 2011-08-07 LAB — BASIC METABOLIC PANEL
CO2: 31 mEq/L (ref 19–32)
Calcium: 9.3 mg/dL (ref 8.4–10.5)
Creatinine, Ser: 0.9 mg/dL (ref 0.4–1.5)
GFR: 116.57 mL/min (ref >60.00)
Glucose, Bld: 173 mg/dL — ABNORMAL HIGH (ref 70–99)
Sodium: 140 mEq/L (ref 135–145)

## 2011-08-07 LAB — CBC WITH DIFFERENTIAL/PLATELET
Basophils Relative: 1 % (ref 0.0–3.0)
Eosinophils Relative: 2.9 % (ref 0.0–5.0)
HCT: 45.8 % (ref 39.0–52.0)
Hemoglobin: 15.1 g/dL (ref 13.0–17.0)
Lymphs Abs: 1.6 10*3/uL (ref 0.7–4.0)
MCV: 84.3 fl (ref 78.0–100.0)
Monocytes Absolute: 0.4 10*3/uL (ref 0.1–1.0)
Monocytes Relative: 9.3 % (ref 3.0–12.0)
Neutro Abs: 2.5 10*3/uL (ref 1.4–7.7)
RBC: 5.43 Mil/uL (ref 4.22–5.81)
WBC: 4.8 10*3/uL (ref 4.5–10.5)

## 2011-08-07 LAB — LIPID PANEL
HDL: 38.9 mg/dL — ABNORMAL LOW (ref >39.00)
Total CHOL/HDL Ratio: 2

## 2011-08-07 LAB — TSH: TSH: 3.07 u[IU]/mL (ref 0.35–5.50)

## 2011-08-07 NOTE — Assessment & Plan Note (Signed)
Td 2010 never had a cscope --- > agreed for a referral Diet and  exercise discussed. Chronic medical problems, he sees endocrinology, BP excellent today. Good medication compliance with high cholesterol medicine. Labs Return to the office in one year He has a family history of heart disease and is a diabetic, discuss with him the need to have diabetes under excellent control to prevent future cardiovascular events

## 2011-08-07 NOTE — Progress Notes (Signed)
  Subjective:    Patient ID: Troy Becker, male    DOB: 1961-02-13, 51 y.o.   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 No dysuria or gross hematuria. Very rarely he has some difficulty urinating. No anxiety or depression.     Objective:   Physical Exam General -- alert, well-developed. No apparent distress.  Neck --no thyromegaly  Lungs -- normal respiratory effort, no intercostal retractions, no accessory muscle use, and normal breath sounds.   Heart-- normal rate, regular rhythm, no murmur, and no gallop.   Abdomen--soft, non-tender, no distention, no masses, no HSM, no guarding, and no rigidity.   Extremities-- no pretibial edema bilaterally  DRE--normal external inspection, rectum normal, no masses. Prostate is not enlarged and not tender. No nodular. Neurologic-- alert & oriented X3 and strength normal in all extremities. Psych-- Cognition and judgment appear intact. Alert and cooperative with normal attention span and concentration.  not anxious appearing and not depressed appearing.      Assessment & Plan:

## 2011-08-07 NOTE — Telephone Encounter (Signed)
Left message for pt to callback office.  

## 2011-08-07 NOTE — Patient Instructions (Signed)
Check the  blood pressure 2 or 3 times a month, be sure it is between 110/60 and 140/85. If it is consistently higher or lower, let me know  

## 2011-08-08 ENCOUNTER — Encounter: Payer: Self-pay | Admitting: Internal Medicine

## 2011-08-08 ENCOUNTER — Encounter: Payer: Self-pay | Admitting: *Deleted

## 2011-08-08 NOTE — Telephone Encounter (Signed)
Left message for pt to callback office and also informed him letter with results was sent. Closing phone note.

## 2011-08-20 ENCOUNTER — Telehealth: Payer: Self-pay | Admitting: *Deleted

## 2011-08-20 NOTE — Telephone Encounter (Signed)
PA form received and completing-awaiting response from insurance company.

## 2011-08-20 NOTE — Telephone Encounter (Signed)
R'cd fax from [employer] for PA of True Test Test Strips-awaiting fax

## 2011-08-22 NOTE — Telephone Encounter (Signed)
PA for test strips denied-brands preferred are [employer] or OneTouch. Left message for pt to callback office to inform pt.

## 2011-08-23 MED ORDER — ONETOUCH ULTRA 2 W/DEVICE KIT: 1.0000 | PACK | Freq: Once | Status: AC

## 2011-08-23 MED ORDER — ONETOUCH DELICA LANCETS MISC: Status: AC

## 2011-08-23 MED ORDER — GLUCOSE BLOOD VI STRP: ORAL_STRIP | Status: DC

## 2011-08-23 NOTE — Telephone Encounter (Signed)
Pt informed via VM of new rx for Onetouch Ultra test strips, meter and lancets and to callback office with any questions/concerns.

## 2011-09-06 ENCOUNTER — Ambulatory Visit: Payer: BC Managed Care – PPO | Admitting: Endocrinology

## 2011-09-11 ENCOUNTER — Encounter: Payer: Self-pay | Admitting: Internal Medicine

## 2011-09-11 ENCOUNTER — Ambulatory Visit (AMBULATORY_SURGERY_CENTER): Payer: BC Managed Care – PPO | Admitting: *Deleted

## 2011-09-11 VITALS — Ht 72.0 in | Wt 239.0 lb

## 2011-09-11 DIAGNOSIS — Z1211 Encounter for screening for malignant neoplasm of colon: Secondary | ICD-10-CM

## 2011-09-11 MED ORDER — MOVIPREP 100 G PO SOLR: ORAL | Status: DC

## 2011-09-13 ENCOUNTER — Ambulatory Visit: Payer: BC Managed Care – PPO | Admitting: Endocrinology

## 2011-09-20 ENCOUNTER — Other Ambulatory Visit: Payer: Self-pay | Admitting: Endocrinology

## 2011-09-25 ENCOUNTER — Encounter: Payer: BC Managed Care – PPO | Admitting: Internal Medicine

## 2011-09-30 ENCOUNTER — Ambulatory Visit (INDEPENDENT_AMBULATORY_CARE_PROVIDER_SITE_OTHER): Payer: BC Managed Care – PPO | Admitting: Endocrinology

## 2011-09-30 ENCOUNTER — Encounter: Payer: Self-pay | Admitting: Endocrinology

## 2011-09-30 VITALS — BP 140/82 | HR 72 | Temp 98.5°F | Wt 234.4 lb

## 2011-09-30 DIAGNOSIS — E119 Type 2 diabetes mellitus without complications: Secondary | ICD-10-CM

## 2011-09-30 NOTE — Patient Instructions (Addendum)
Please start taking the insulin, 10 units each morning, with breakfast.   Please come back for a follow-up appointment for 1 month.   check your blood sugar twice a day.  vary the time of day when you check, between before the 3 meals, and at bedtime.  also check if you have symptoms of your blood sugar being too high or too low.  please keep a record of the readings and bring it to your next appointment here.  please call us sooner if your blood sugar goes below 70, or if it stays over 200.

## 2011-09-30 NOTE — Progress Notes (Signed)
Subjective:    Patient ID: Troy Becker, male    DOB: 20-Dec-1960, 51 y.o.   MRN: [id number]  HPI Pt returns for f/u of type 2 DM (dx'ed 1999; no known complications).  pt states he feels well in general.  no cbg record, but states cbg's are in the mid-100's.  He has lost a few lbs, due to his efforts.  He has not started the insulin, but he agrees to start qd. Past Medical History  Diagnosis Date  . Hypertension   . Diabetes mellitus   . Hyperlipidemia   . Abnormal EKG 2006    w/ a neg stress test    Past Surgical History  Procedure Date  . Multiple tooth extractions     History   Social History  . Marital Status: Married    Spouse Name: N/A    Number of Children: N/A  . Years of Education: N/A   Occupational History  . Project Management (Customer service manager) sedentary     Social History Main Topics  . Smoking status: Never Smoker   . Smokeless tobacco: Never Used  . Alcohol Use: No  . Drug Use: No  . Sexually Active: Not on file   Other Topics Concern  . Not on file   Social History Narrative   Exercise- was walking some until the weather turned cold Diet- have cut down on sugar and salt     Current Outpatient Prescriptions on File Prior to Visit  Medication Sig Dispense Refill  . aspirin 81 MG tablet Take 81 mg by mouth daily.        . Blood Glucose Monitoring Suppl (ONE TOUCH ULTRA 2) W/DEVICE KIT 1 Device by Does not apply route once.  1 each  0  . glipiZIDE (GLUCOTROL) 10 MG tablet TAKE 1 TABLET BY MOUTH TWICE DAILY  60 tablet  4  . glucose blood (ONE TOUCH ULTRA TEST) test strip Use as instructed to check blood sugar twice daily dx 250.02  100 each  5  . hydrochlorothiazide (HYDRODIURIL) 25 MG tablet TAKE 1 TABLET BY MOUTH EVERY MORNING  30 tablet  4  . insulin aspart protamine-insulin aspart (NOVOLOG MIX 70/30 FLEXPEN) (70-30) 100 UNIT/ML injection Inject 10 Units into the skin daily with breakfast. And pen needles 1/day  15 mL  12  . metFORMIN  (GLUCOPHAGE) 1000 MG tablet TAKE 1 1/2 TABLET BY MOUTH TWICE DAILY  90 tablet  4  . MOVIPREP 100 G SOLR moviprep as directed.  No substitutions  1 kit  0  . ONETOUCH DELICA LANCETS MISC Use as directed to check blood sugar twice daily dx 250.02  100 each  5  . terbinafine (LAMISIL) 250 MG tablet Take 1 tablet (250 mg total) by mouth daily.  90 tablet  0  . VYTORIN 10-40 MG per tablet TAKE 1 TABLET BY MOUTH EVERY DAY  30 tablet  4    No Known Allergies  Family History  Problem Relation Age of Onset  . Coronary artery disease Father     CABG 21  . Hypertension Father   . Hypertension Mother   . Hypertension Sister   . Hypertension Brother   . Colon cancer Neg Hx   . Prostate cancer Neg Hx   . Diabetes Brother     BP 140/82  Pulse 72  Temp 98.5 F (36.9 C) (Oral)  Wt 234 lb 6.4 oz (106.323 kg)  SpO2 98%  Review of Systems denies hypoglycemia    Objective:  Physical Exam VITAL SIGNS:  See vs page GENERAL: no distress SKIN:  Insulin injection sites at the anterior abdomen are normal.   Lab Results  Component Value Date   HGBA1C 12.8* 08/05/2011      Assessment & Plan:  DM.  Pt agrees to start insulin, but only qd.

## 2011-10-03 ENCOUNTER — Encounter: Payer: Self-pay | Admitting: Internal Medicine

## 2011-10-03 ENCOUNTER — Ambulatory Visit (AMBULATORY_SURGERY_CENTER): Payer: BC Managed Care – PPO | Admitting: Internal Medicine

## 2011-10-03 VITALS — BP 160/111 | HR 73 | Temp 96.8°F | Resp 16 | Ht 72.0 in | Wt 239.0 lb

## 2011-10-03 DIAGNOSIS — D126 Benign neoplasm of colon, unspecified: Secondary | ICD-10-CM

## 2011-10-03 DIAGNOSIS — Z1211 Encounter for screening for malignant neoplasm of colon: Secondary | ICD-10-CM

## 2011-10-03 MED ORDER — SODIUM CHLORIDE 0.9 % IV SOLN: 500.0000 mL | INTRAVENOUS | Status: DC

## 2011-10-03 NOTE — Progress Notes (Signed)
Patient did not experience any of the following events: a burn prior to discharge; a fall within the facility; wrong site/side/patient/procedure/implant event; or a hospital transfer or hospital admission upon discharge from the facility. (G8907) Patient did not have preoperative order for IV antibiotic SSI prophylaxis. (G8918)  

## 2011-10-03 NOTE — Op Note (Signed)
[facility] [address] [place], [place]  [address]  COLONOSCOPY PROCEDURE REPORT  PATIENT:  Troy, Becker  MR#:  [id number] BIRTHDATE:  Dec 26, 1960, 50 yrs. old  GENDER:  male ENDOSCOPIST:  Iva Boop, MD, [facility] REF. BY:  Marga Melnick, M.D. PROCEDURE DATE:  10/03/2011 PROCEDURE:  Colonoscopy with snare polypectomy ASA CLASS:  Class II INDICATIONS:  Routine Risk Screening MEDICATIONS:   These medications were titrated to patient response per physician's verbal order, MAC sedation, administered by CRNA, propofol (Diprivan) 280 mg IV  DESCRIPTION OF PROCEDURE:   After the risks benefits and alternatives of the procedure were thoroughly explained, informed consent was obtained.  Digital rectal exam was performed and revealed no abnormalities and normal prostate.   The LB [id number] endoscope was introduced through the anus and advanced to the cecum, which was identified by both the appendix and ileocecal valve, without limitations.  The quality of the prep was good, using MoviPrep.  The instrument was then slowly withdrawn as the colon was fully examined. <<PROCEDUREIMAGES>>  FINDINGS:  Two polyps were found. Polyp at hepatic flexure - 8 mm. Polyp in sigmoid colon - 3mm. Both removed with cold snare and sent to pathology.  This was otherwise a normal examination of the colon.   Retroflexed views in the rectum revealed no abnormalities.    The time to cecum = 4:54  minutes. The scope was carefully withdrawn in 15:05 minutes from the cecum and the procedure completed. COMPLICATIONS:  None ENDOSCOPIC IMPRESSION: 1) Two polyps removed, largest 8mm 2) Otherwise normal examination with good prep  REPEAT EXAM:  In for Colonoscopy, pending biopsy results.  Iva Boop, MD, [facility]  CC:  Willow Ora, MD and The Patient  n. eSIGNED:   Iva Boop at 10/03/2011 10:06 AM  Romeo Apple, [phone]

## 2011-10-03 NOTE — Patient Instructions (Addendum)
Two polyps were removed today. They both look benign.  The colon was otherwise normal.  Thank you for choosing me and [facility].  Iva Boop, MD, FACG   YOU HAD AN ENDOSCOPIC PROCEDURE TODAY AT THE [facility]: Refer to the procedure report that was given to you for any specific questions about what was found during the examination.  If the procedure report does not answer your questions, please call your gastroenterologist to clarify.  If you requested that your care partner not be given the details of your procedure findings, then the procedure report has been included in a sealed envelope for you to review at your convenience later.  YOU SHOULD EXPECT: Some feelings of bloating in the abdomen. Passage of more gas than usual.  Walking can help get rid of the air that was put into your GI tract during the procedure and reduce the bloating. If you had a lower endoscopy (such as a colonoscopy or flexible sigmoidoscopy) you may notice spotting of blood in your stool or on the toilet paper. If you underwent a bowel prep for your procedure, then you may not have a normal bowel movement for a few days.  DIET: Your first meal following the procedure should be a light meal and then it is ok to progress to your normal diet.  A half-sandwich or bowl of soup is an example of a good first meal.  Heavy or fried foods are harder to digest and may make you feel nauseous or bloated.  Likewise meals heavy in dairy and vegetables can cause extra gas to form and this can also increase the bloating.  Drink plenty of fluids but you should avoid alcoholic beverages for 24 hours.  ACTIVITY: Your care partner should take you home directly after the procedure.  You should plan to take it easy, moving slowly for the rest of the day.  You can resume normal activity the day after the procedure however you should NOT DRIVE or use heavy machinery for 24 hours (because of the sedation medicines  used during the test).    SYMPTOMS TO REPORT IMMEDIATELY: A gastroenterologist can be reached at any hour.  During normal business hours, 8:30 AM to 5:00 PM Monday through Friday, call [phone].  After hours and on weekends, please call the GI answering service at [phone] who will take a message and have the physician on call contact you.   Following lower endoscopy (colonoscopy or flexible sigmoidoscopy):  Excessive amounts of blood in the stool  Significant tenderness or worsening of abdominal pains  Swelling of the abdomen that is new, acute  Fever of 100F or higher   FOLLOW UP: If any biopsies were taken you will be contacted by phone or by letter within the next 1-3 weeks.  Call your gastroenterologist if you have not heard about the biopsies in 3 weeks.  Our staff will call the home number listed on your records the next business day following your procedure to check on you and address any questions or concerns that you may have at that time regarding the information given to you following your procedure. This is a courtesy call and so if there is no answer at the home number and we have not heard from you through the emergency physician on call, we will assume that you have returned to your regular daily activities without incident.  SIGNATURES/CONFIDENTIALITY: You and/or your care partner have signed paperwork which will be entered into your electronic  medical record.  These signatures attest to the fact that that the information above on your After Visit Summary has been reviewed and is understood.  Full responsibility of the confidentiality of this discharge information lies with you and/or your care-partner.   Polyp information given

## 2011-10-04 ENCOUNTER — Telehealth: Payer: Self-pay | Admitting: *Deleted

## 2011-10-04 NOTE — Telephone Encounter (Signed)
No answer.  Left message to call office if questions or concerns. 

## 2011-10-10 ENCOUNTER — Encounter: Payer: Self-pay | Admitting: Internal Medicine

## 2011-10-10 DIAGNOSIS — Z860100 Personal history of colon polyps, unspecified: Secondary | ICD-10-CM | POA: Insufficient documentation

## 2011-10-10 NOTE — Progress Notes (Signed)
Quick Note:  2 adenomas Repeat colon about 10/2016 ______

## 2011-10-26 ENCOUNTER — Other Ambulatory Visit: Payer: Self-pay | Admitting: Internal Medicine

## 2011-10-28 NOTE — Telephone Encounter (Signed)
Refill done.  

## 2012-03-02 ENCOUNTER — Other Ambulatory Visit: Payer: Self-pay | Admitting: Endocrinology

## 2012-03-09 ENCOUNTER — Other Ambulatory Visit: Payer: Self-pay | Admitting: Endocrinology

## 2012-03-09 ENCOUNTER — Other Ambulatory Visit: Payer: Self-pay

## 2012-03-09 MED ORDER — GLIPIZIDE 10 MG PO TABS: 10.0000 mg | ORAL_TABLET | Freq: Once | ORAL | Status: DC

## 2012-03-09 MED ORDER — METFORMIN HCL 1000 MG PO TABS: 1000.0000 mg | ORAL_TABLET | Freq: Once | ORAL | Status: DC

## 2012-03-20 ENCOUNTER — Ambulatory Visit (INDEPENDENT_AMBULATORY_CARE_PROVIDER_SITE_OTHER): Payer: BC Managed Care – PPO | Admitting: Endocrinology

## 2012-03-20 VITALS — BP 136/72 | HR 80 | Temp 97.8°F | Wt 234.0 lb

## 2012-03-20 DIAGNOSIS — E119 Type 2 diabetes mellitus without complications: Secondary | ICD-10-CM

## 2012-03-20 LAB — HEMOGLOBIN A1C: Hgb A1c MFr Bld: 11.6 % — ABNORMAL HIGH (ref 4.6–6.5)

## 2012-03-20 NOTE — Patient Instructions (Addendum)
blood tests are being requested for you today.  We'll contact you with results. Please come back for a follow-up appointment in 3 months. check your blood sugar twice a day.  vary the time of day when you check, between before the 3 meals, and at bedtime.  also check if you have symptoms of your blood sugar being too high or too low.  please keep a record of the readings and bring it to your next appointment here.  please call us sooner if your blood sugar goes below 70, or if it stays over 200.

## 2012-03-20 NOTE — Progress Notes (Signed)
Subjective:    Patient ID: Troy Becker, male    DOB: 02/14/1961, 52 y.o.   MRN: [id number]  HPI Pt returns for f/u of type 2 DM (dx'ed 1999; no known complications).  pt states he feels well in general.  no cbg record, but states cbg's are in the mid-100's.  He has chosen qd insulin, and takes it inconsistently.  pt states he feels well in general, except for weight gain.  no cbg record, but states cbg's are in the high-100's. He takes 2 oral meds. Past Medical History  Diagnosis Date  . Hypertension   . Diabetes mellitus   . Hyperlipidemia   . Abnormal EKG 2006    w/ a neg stress test    Past Surgical History  Procedure Date  . Multiple tooth extractions     History   Social History  . Marital Status: Married    Spouse Name: N/A    Number of Children: N/A  . Years of Education: N/A   Occupational History  . Project Management (Customer service manager) sedentary     Social History Main Topics  . Smoking status: Never Smoker   . Smokeless tobacco: Never Used  . Alcohol Use: No  . Drug Use: No  . Sexually Active: Not on file   Other Topics Concern  . Not on file   Social History Narrative   Exercise- was walking some until the weather turned cold Diet- have cut down on sugar and salt     Current Outpatient Prescriptions on File Prior to Visit  Medication Sig Dispense Refill  . aspirin 81 MG tablet Take 81 mg by mouth daily.        . Blood Glucose Monitoring Suppl (ONE TOUCH ULTRA 2) W/DEVICE KIT 1 Device by Does not apply route once.  1 each  0  . glipiZIDE (GLUCOTROL) 10 MG tablet Take 1 tablet (10 mg total) by mouth once.  60 tablet  1  . glucose blood (ONE TOUCH ULTRA TEST) test strip Use as instructed to check blood sugar twice daily dx 250.02  100 each  5  . hydrochlorothiazide (HYDRODIURIL) 25 MG tablet TAKE 1 TABLET BY MOUTH EVERY MORNING  30 tablet  9  . insulin aspart protamine-insulin aspart (NOVOLOG MIX 70/30 FLEXPEN) (70-30) 100 UNIT/ML injection Inject 10  Units into the skin daily with breakfast. And pen needles 1/day  15 mL  12  . metFORMIN (GLUCOPHAGE) 1000 MG tablet Take 1 tablet (1,000 mg total) by mouth once.  90 tablet  1  . ONETOUCH DELICA LANCETS MISC Use as directed to check blood sugar twice daily dx 250.02  100 each  5  . terbinafine (LAMISIL) 250 MG tablet Take 1 tablet (250 mg total) by mouth daily.  90 tablet  0  . VYTORIN 10-40 MG per tablet TAKE 1 TABLET BY MOUTH EVERY DAY  30 tablet  9   Current Facility-Administered Medications on File Prior to Visit  Medication Dose Route Frequency Provider Last Rate Last Dose  . 0.9 %  sodium chloride infusion  500 mL Intravenous Continuous Iva Boop, MD        No Known Allergies  Family History  Problem Relation Age of Onset  . Coronary artery disease Father     CABG 87  . Hypertension Father   . Hypertension Mother   . Hypertension Sister   . Hypertension Brother   . Colon cancer Neg Hx   . Prostate cancer Neg Hx   .  Diabetes Brother     BP 136/72  Pulse 80  Temp 97.8 F (36.6 C) (Oral)  Wt 234 lb (106.142 kg)  SpO2 95%  Review of Systems denies hypoglycemia    Objective:   Physical Exam VITAL SIGNS:  See vs page GENERAL: no distress Pulses: dorsalis pedis intact bilat.   Feet: no deformity.  no ulcer on the feet.  feet are of normal color and temp.  no edema.  There is bilateral onychomycosis. Neuro: sensation is intact to touch on the feet.     Assessment & Plan:  DM, therapy limited by noncompliance.  i'll do the best i can.  Uncertain control.

## 2012-03-25 ENCOUNTER — Telehealth: Payer: Self-pay

## 2012-03-25 NOTE — Telephone Encounter (Signed)
Please call pt's cell # [phone] to r/s not home #

## 2012-03-25 NOTE — Telephone Encounter (Signed)
Pt needs to r/s appt, please call

## 2012-03-26 ENCOUNTER — Telehealth: Payer: Self-pay | Admitting: Endocrinology

## 2012-04-21 ENCOUNTER — Ambulatory Visit: Payer: BC Managed Care – PPO | Admitting: Endocrinology

## 2012-04-29 ENCOUNTER — Ambulatory Visit (INDEPENDENT_AMBULATORY_CARE_PROVIDER_SITE_OTHER): Payer: BC Managed Care – PPO | Admitting: Endocrinology

## 2012-04-29 ENCOUNTER — Encounter: Payer: Self-pay | Admitting: Endocrinology

## 2012-04-29 VITALS — BP 126/70 | HR 76 | Wt 236.0 lb

## 2012-04-29 DIAGNOSIS — E119 Type 2 diabetes mellitus without complications: Secondary | ICD-10-CM

## 2012-04-29 MED ORDER — GLUCOSE BLOOD VI STRP: ORAL_STRIP | Status: AC

## 2012-04-29 NOTE — Progress Notes (Signed)
Subjective:    Patient ID: Troy Becker, male    DOB: August 21, 1960, 52 y.o.   MRN: [id number]  HPI Pt returns for f/u of type 2 DM (dx'ed 1999; no known complications; therapy limited by pt's need for a simple regimen--he has chosen qd insulin).  pt states he feels well in general.  no cbg record, but states cbg's are 100-240.  he has chosen qd insulin, and takes it inconsistently.  He says he need his wife's help, because he often can't bring himself to inject the insulin.  no cbg record, but states cbg's are in the high-100's.  Past Medical History  Diagnosis Date  . Hypertension   . Diabetes mellitus   . Hyperlipidemia   . Abnormal EKG 2006    w/ a neg stress test    Past Surgical History  Procedure Laterality Date  . Multiple tooth extractions      History   Social History  . Marital Status: Married    Spouse Name: N/A    Number of Children: N/A  . Years of Education: N/A   Occupational History  . Project Management (Customer service manager) sedentary     Social History Main Topics  . Smoking status: Never Smoker   . Smokeless tobacco: Never Used  . Alcohol Use: No  . Drug Use: No  . Sexually Active: Not on file   Other Topics Concern  . Not on file   Social History Narrative   Exercise- was walking some until the weather turned cold    Diet- have cut down on sugar and salt           Current Outpatient Prescriptions on File Prior to Visit  Medication Sig Dispense Refill  . aspirin 81 MG tablet Take 81 mg by mouth daily.        . Blood Glucose Monitoring Suppl (ONE TOUCH ULTRA 2) W/DEVICE KIT 1 Device by Does not apply route once.  1 each  0  . glipiZIDE (GLUCOTROL) 10 MG tablet Take 1 tablet (10 mg total) by mouth once.  60 tablet  1  . hydrochlorothiazide (HYDRODIURIL) 25 MG tablet TAKE 1 TABLET BY MOUTH EVERY MORNING  30 tablet  9  . insulin aspart protamine-insulin aspart (NOVOLOG MIX 70/30 FLEXPEN) (70-30) 100 UNIT/ML injection Inject 10 Units into the skin  daily with breakfast. And pen needles 1/day  15 mL  12  . metFORMIN (GLUCOPHAGE) 1000 MG tablet Take 1 tablet (1,000 mg total) by mouth once.  90 tablet  1  . ONETOUCH DELICA LANCETS MISC Use as directed to check blood sugar twice daily dx 250.02  100 each  5  . terbinafine (LAMISIL) 250 MG tablet Take 1 tablet (250 mg total) by mouth daily.  90 tablet  0  . VYTORIN 10-40 MG per tablet TAKE 1 TABLET BY MOUTH EVERY DAY  30 tablet  9   Current Facility-Administered Medications on File Prior to Visit  Medication Dose Route Frequency Provider Last Rate Last Dose  . 0.9 %  sodium chloride infusion  500 mL Intravenous Continuous Iva Boop, MD        No Known Allergies  Family History  Problem Relation Age of Onset  . Coronary artery disease Father     CABG 59  . Hypertension Father   . Hypertension Mother   . Hypertension Sister   . Hypertension Brother   . Colon cancer Neg Hx   . Prostate cancer Neg Hx   . Diabetes  Brother     BP 126/70  Pulse 76  Wt 236 lb (107.049 kg)  BMI 32 kg/m2  SpO2 97%  Review of Systems denies hypoglycemia.      Objective:   Physical Exam VITAL SIGNS:  See vs page GENERAL: no distress Pulses: dorsalis pedis intact bilat.   Feet: no deformity.  no ulcer on the feet.  feet are of normal color and temp.  no edema.  There is bilateral onychomycosis Neuro: sensation is intact to touch on the feet.    Lab Results  Component Value Date   HGBA1C 11.6* 03/20/2012      Assessment & Plan:  DM: he is really struggling with functional issues.  i gave him a new meter, as his reported cbg are much lower than would be suggested by this a1c.

## 2012-04-29 NOTE — Patient Instructions (Addendum)
blood tests are being requested for you today.  We'll contact you with results.  Please come back for a follow-up appointment in 1 month.  check your blood sugar twice a day.  vary the time of day when you check, between before the 3 meals, and at bedtime.  also check if you have symptoms of your blood sugar being too high or too low.  please keep a record of the readings and bring it to your next appointment here.  please call us sooner if your blood sugar goes below 70, or if it stays over 200.   Refer to a diabetes education specialist.  you will receive a phone call, about a day and time for an appointment.

## 2012-05-12 ENCOUNTER — Telehealth: Payer: Self-pay | Admitting: Endocrinology

## 2012-05-12 ENCOUNTER — Encounter: Payer: BC Managed Care – PPO | Attending: Endocrinology

## 2012-05-12 NOTE — Telephone Encounter (Signed)
Please call patient w/ flex pen dosing. CB# [phone] / Troy Becker

## 2012-05-12 NOTE — Telephone Encounter (Signed)
Pt advised of Novolog dosage

## 2012-06-25 ENCOUNTER — Ambulatory Visit: Payer: BC Managed Care – PPO | Admitting: Endocrinology

## 2012-07-01 ENCOUNTER — Ambulatory Visit (INDEPENDENT_AMBULATORY_CARE_PROVIDER_SITE_OTHER): Payer: BC Managed Care – PPO | Admitting: Endocrinology

## 2012-07-01 ENCOUNTER — Encounter: Payer: Self-pay | Admitting: Endocrinology

## 2012-07-01 VITALS — BP 124/76 | HR 76 | Wt 239.0 lb

## 2012-07-01 DIAGNOSIS — E119 Type 2 diabetes mellitus without complications: Secondary | ICD-10-CM

## 2012-07-01 NOTE — Progress Notes (Signed)
Subjective:    Patient ID: Troy Becker, male    DOB: 03-28-1960, 52 y.o.   MRN: [id number]  HPI Pt returns for f/u of type 2 DM (dx'ed 1999; no known complications; therapy limited by pt's need for a simple regimen--he has chosen qd insulin).  pt states he feels well in general.  no cbg record, but states cbg's are 170-190.  He says he now takes the insulin consistently.   Past Medical History  Diagnosis Date  . Hypertension   . Diabetes mellitus   . Hyperlipidemia   . Abnormal EKG 2006    w/ a neg stress test    Past Surgical History  Procedure Laterality Date  . Multiple tooth extractions      History   Social History  . Marital Status: Married    Spouse Name: N/A    Number of Children: N/A  . Years of Education: N/A   Occupational History  . Project Management (Customer service manager) sedentary     Social History Main Topics  . Smoking status: Never Smoker   . Smokeless tobacco: Never Used  . Alcohol Use: No  . Drug Use: No  . Sexually Active: Not on file   Other Topics Concern  . Not on file   Social History Narrative   Exercise- was walking some until the weather turned cold    Diet- have cut down on sugar and salt           Current Outpatient Prescriptions on File Prior to Visit  Medication Sig Dispense Refill  . aspirin 81 MG tablet Take 81 mg by mouth daily.        . Blood Glucose Monitoring Suppl (ONE TOUCH ULTRA 2) W/DEVICE KIT 1 Device by Does not apply route once.  1 each  0  . glucose blood (ONE TOUCH ULTRA TEST) test strip Use as instructed to check blood sugar twice daily dx 250.02  100 each  5  . hydrochlorothiazide (HYDRODIURIL) 25 MG tablet TAKE 1 TABLET BY MOUTH EVERY MORNING  30 tablet  9  . insulin aspart protamine-insulin aspart (NOVOLOG MIX 70/30 FLEXPEN) (70-30) 100 UNIT/ML injection Inject 10 Units into the skin daily with breakfast. And pen needles 1/day  15 mL  12  . ONETOUCH DELICA LANCETS MISC Use as directed to check blood sugar  twice daily dx 250.02  100 each  5  . terbinafine (LAMISIL) 250 MG tablet Take 1 tablet (250 mg total) by mouth daily.  90 tablet  0  . VYTORIN 10-40 MG per tablet TAKE 1 TABLET BY MOUTH EVERY DAY  30 tablet  9   Current Facility-Administered Medications on File Prior to Visit  Medication Dose Route Frequency Provider Last Rate Last Dose  . 0.9 %  sodium chloride infusion  500 mL Intravenous Continuous Iva Boop, MD        No Known Allergies  Family History  Problem Relation Age of Onset  . Coronary artery disease Father     CABG 35  . Hypertension Father   . Hypertension Mother   . Hypertension Sister   . Hypertension Brother   . Colon cancer Neg Hx   . Prostate cancer Neg Hx   . Diabetes Brother    BP 124/76  Pulse 76  Wt 239 lb (108.41 kg)  BMI 32.41 kg/m2  SpO2 96%  Review of Systems denies hypoglycemia    Objective:   Physical Exam VITAL SIGNS:  See vs page GENERAL: no distress  SKIN:  Insulin injection sites at the anterior abdomen are normal     Assessment & Plan:

## 2012-07-01 NOTE — Progress Notes (Signed)
  Subjective:    Patient ID: Troy Becker, male    DOB: 08-21-1960, 52 y.o.   MRN: [id number]  HPI    Review of Systems     Objective:   Physical Exam        Assessment & Plan:  DM: he is ready to transition to insulin only

## 2012-07-01 NOTE — Patient Instructions (Addendum)
Please stop taking the glipizide and metformin. Please increase the insulin to 20 units with breakfast.  Please come back for a follow-up appointment in 6 weeks.  check your blood sugar twice a day.  vary the time of day when you check, between before the 3 meals, and at bedtime.  also check if you have symptoms of your blood sugar being too high or too low.  please keep a record of the readings and bring it to your next appointment here.  please call us sooner if your blood sugar goes below 70, or if it stays over 200.   blood tests are being requested for you today.  We'll contact you with results.

## 2012-07-02 ENCOUNTER — Telehealth: Payer: Self-pay | Admitting: *Deleted

## 2012-07-02 NOTE — Telephone Encounter (Signed)
Pt called requesting HgbA1C results. Please advise.

## 2012-07-03 NOTE — Telephone Encounter (Signed)
please call patient: Blood sugar is really high Please increase insulin as we discussed Please come back for a follow-up appointment in 2 weeks

## 2012-07-03 NOTE — Telephone Encounter (Signed)
LVM

## 2012-08-12 ENCOUNTER — Encounter: Payer: Self-pay | Admitting: Endocrinology

## 2012-08-12 ENCOUNTER — Ambulatory Visit (INDEPENDENT_AMBULATORY_CARE_PROVIDER_SITE_OTHER): Payer: BC Managed Care – PPO | Admitting: Endocrinology

## 2012-08-12 VITALS — BP 126/74 | HR 80 | Ht 71.0 in | Wt 235.0 lb

## 2012-08-12 DIAGNOSIS — E119 Type 2 diabetes mellitus without complications: Secondary | ICD-10-CM

## 2012-08-12 NOTE — Progress Notes (Signed)
Subjective:    Patient ID: Troy Becker, male    DOB: [birth year], 52 y.o.   MRN: [id number]  HPI Pt returns for f/u of type 2 DM (dx'ed 1999; He has mild if any neuropathy of the lower extremities. no known associated complications; therapy limited by pt's need for a simple regimen--he has chosen qd insulin).  pt states he feels well in general. he brings a record of his cbg's which i have reviewed today.  It varies from 87-400's, but most are in the 300's.  He is not sure why it was as low as 87.   Past Medical History  Diagnosis Date  . Hypertension   . Diabetes mellitus   . Hyperlipidemia   . Abnormal EKG 2006    w/ a neg stress test    Past Surgical History  Procedure Laterality Date  . Multiple tooth extractions      History   Social History  . Marital Status: Married    Spouse Name: N/A    Number of Children: N/A  . Years of Education: N/A   Occupational History  . Project Management (Customer service manager) sedentary     Social History Main Topics  . Smoking status: Never Smoker   . Smokeless tobacco: Never Used  . Alcohol Use: No  . Drug Use: No  . Sexually Active: Not on file   Other Topics Concern  . Not on file   Social History Narrative   Exercise- was walking some until the weather turned cold    Diet- have cut down on sugar and salt           Current Outpatient Prescriptions on File Prior to Visit  Medication Sig Dispense Refill  . aspirin 81 MG tablet Take 81 mg by mouth daily.        . Blood Glucose Monitoring Suppl (ONE TOUCH ULTRA 2) W/DEVICE KIT 1 Device by Does not apply route once.  1 each  0  . glucose blood (ONE TOUCH ULTRA TEST) test strip Use as instructed to check blood sugar twice daily dx 250.02  100 each  5  . hydrochlorothiazide (HYDRODIURIL) 25 MG tablet TAKE 1 TABLET BY MOUTH EVERY MORNING  30 tablet  9  . ONETOUCH DELICA LANCETS MISC Use as directed to check blood sugar twice daily dx 250.02  100 each  5  . VYTORIN 10-40 MG per  tablet TAKE 1 TABLET BY MOUTH EVERY DAY  30 tablet  9  . insulin aspart protamine- aspart (NOVOLOG 70/30) (70-30) 100 UNIT/ML injection Inject 30 Units into the skin daily with breakfast. And pen needles 1/day       Current Facility-Administered Medications on File Prior to Visit  Medication Dose Route Frequency Provider Last Rate Last Dose  . 0.9 %  sodium chloride infusion  500 mL Intravenous Continuous Iva Boop, MD        No Known Allergies  Family History  Problem Relation Age of Onset  . Coronary artery disease Father     CABG 58  . Hypertension Father   . Hypertension Mother   . Hypertension Sister   . Hypertension Brother   . Colon cancer Neg Hx   . Prostate cancer Neg Hx   . Diabetes Brother    BP 126/74  Pulse 80  Ht 5\' 11"  (1.803 m)  Wt 235 lb (106.595 kg)  BMI 32.79 kg/m2  SpO2 97%  Review of Systems denies hypoglycemia.  He has lost a few lbs,  due to his efforts.     Objective:   Physical Exam VITAL SIGNS:  See vs page GENERAL: no distress     Assessment & Plan:  DM: This insulin regimen was chosen from multiple options, for its simplicity.  The benefits of glycemic control must be weighed against the risks of hypoglycemia.

## 2012-08-12 NOTE — Patient Instructions (Addendum)
Please increase the insulin to 30 units with breakfast.  Please come back for a follow-up appointment in 6 weeks.  check your blood sugar twice a day.  vary the time of day when you check, between before the 3 meals, and at bedtime.  also check if you have symptoms of your blood sugar being too high or too low.  please keep a record of the readings and bring it to your next appointment here.  please call us sooner if your blood sugar goes below 70, or if you have a lot of readings over 200.

## 2012-09-22 ENCOUNTER — Ambulatory Visit: Payer: BC Managed Care – PPO | Admitting: Endocrinology

## 2012-10-12 ENCOUNTER — Other Ambulatory Visit: Payer: Self-pay | Admitting: Endocrinology

## 2012-10-13 ENCOUNTER — Other Ambulatory Visit: Payer: Self-pay | Admitting: *Deleted

## 2012-10-13 MED ORDER — INSULIN ASPART PROT & ASPART (70-30 MIX) 100 UNIT/ML PEN: 30.0000 [IU] | PEN_INJECTOR | Freq: Every day | SUBCUTANEOUS | Status: DC

## 2012-10-16 ENCOUNTER — Ambulatory Visit (INDEPENDENT_AMBULATORY_CARE_PROVIDER_SITE_OTHER): Payer: BC Managed Care – PPO | Admitting: Endocrinology

## 2012-10-16 ENCOUNTER — Encounter: Payer: Self-pay | Admitting: Endocrinology

## 2012-10-16 VITALS — BP 136/70 | HR 78 | Ht 71.0 in | Wt 235.0 lb

## 2012-10-16 DIAGNOSIS — E119 Type 2 diabetes mellitus without complications: Secondary | ICD-10-CM

## 2012-10-16 NOTE — Progress Notes (Signed)
Subjective:    Patient ID: Troy Becker, male    DOB: June 14, 1960, 52 y.o.   MRN: [id number]  HPI Pt returns for f/u of type 2 DM (dx'ed 1999; he has mild if any neuropathy of the lower extremities. no known associated complications; therapy limited by pt's need for a simple regimen--he has chosen qd insulin).  pt states he feels well in general.  no cbg record, but states cbg's vary from 114-369.  It is in general higher as the day goes on.   Past Medical History  Diagnosis Date  . Hypertension   . Diabetes mellitus   . Hyperlipidemia   . Abnormal EKG 2006    w/ a neg stress test    Past Surgical History  Procedure Laterality Date  . Multiple tooth extractions      History   Social History  . Marital Status: Married    Spouse Name: N/A    Number of Children: N/A  . Years of Education: N/A   Occupational History  . Project Management (Customer service manager) sedentary     Social History Main Topics  . Smoking status: Never Smoker   . Smokeless tobacco: Never Used  . Alcohol Use: No  . Drug Use: No  . Sexual Activity: Not on file   Other Topics Concern  . Not on file   Social History Narrative   Exercise- was walking some until the weather turned cold    Diet- have cut down on sugar and salt           Current Outpatient Prescriptions on File Prior to Visit  Medication Sig Dispense Refill  . aspirin 81 MG tablet Take 81 mg by mouth daily.        . B-D ULTRAFINE III SHORT PEN 31G X 8 MM MISC       . Blood Glucose Monitoring Suppl (ONE TOUCH ULTRA 2) W/DEVICE KIT 1 Device by Does not apply route once.  1 each  0  . glucose blood (ONE TOUCH ULTRA TEST) test strip Use as instructed to check blood sugar twice daily dx 250.02  100 each  5  . hydrochlorothiazide (HYDRODIURIL) 25 MG tablet TAKE 1 TABLET BY MOUTH EVERY MORNING  30 tablet  9  . ONETOUCH DELICA LANCETS MISC Use as directed to check blood sugar twice daily dx 250.02  100 each  5  . VYTORIN 10-40 MG per tablet  TAKE 1 TABLET BY MOUTH EVERY DAY  30 tablet  9   Current Facility-Administered Medications on File Prior to Visit  Medication Dose Route Frequency Provider Last Rate Last Dose  . 0.9 %  sodium chloride infusion  500 mL Intravenous Continuous Iva Boop, MD        No Known Allergies  Family History  Problem Relation Age of Onset  . Coronary artery disease Father     CABG 28  . Hypertension Father   . Hypertension Mother   . Hypertension Sister   . Hypertension Brother   . Colon cancer Neg Hx   . Prostate cancer Neg Hx   . Diabetes Brother    BP 136/70  Pulse 78  Ht 5\' 11"  (1.803 m)  Wt 235 lb (106.595 kg)  BMI 32.79 kg/m2  SpO2 97%  Review of Systems denies hypoglycemia and weight change.      Objective:   Physical Exam VITAL SIGNS:  See vs page GENERAL: no distress   Lab Results  Component Value Date   HGBA1C  12.5* 10/16/2012      Assessment & Plan:  DM: he needs increased rx.  This insulin regimen was chosen from multiple options, for its simplicity.  The benefits of glycemic control must be weighed against the risks of hypoglycemia.

## 2012-10-16 NOTE — Patient Instructions (Addendum)
Please increase the insulin to 35 units with breakfast.  Please come back for a follow-up appointment in 6 weeks.  check your blood sugar twice a day.  vary the time of day when you check, between before the 3 meals, and at bedtime.  also check if you have symptoms of your blood sugar being too high or too low.  please keep a record of the readings and bring it to your next appointment here.  please call us sooner if your blood sugar goes below 70, or if you have a lot of readings over 200.

## 2012-10-21 ENCOUNTER — Other Ambulatory Visit: Payer: Self-pay | Admitting: Internal Medicine

## 2012-10-22 NOTE — Telephone Encounter (Signed)
Refill done for 1 m/o by protocol Pt has appointment pending 11/12/12

## 2012-10-28 ENCOUNTER — Other Ambulatory Visit: Payer: Self-pay | Admitting: Internal Medicine

## 2012-11-12 ENCOUNTER — Encounter: Payer: Self-pay | Admitting: Internal Medicine

## 2012-11-12 ENCOUNTER — Ambulatory Visit (INDEPENDENT_AMBULATORY_CARE_PROVIDER_SITE_OTHER): Payer: BC Managed Care – PPO | Admitting: Internal Medicine

## 2012-11-12 VITALS — BP 140/80 | HR 86 | Temp 98.2°F | Ht 71.2 in | Wt 238.2 lb

## 2012-11-12 DIAGNOSIS — F411 Generalized anxiety disorder: Secondary | ICD-10-CM

## 2012-11-12 DIAGNOSIS — E785 Hyperlipidemia, unspecified: Secondary | ICD-10-CM

## 2012-11-12 DIAGNOSIS — I10 Essential (primary) hypertension: Secondary | ICD-10-CM

## 2012-11-12 DIAGNOSIS — Z0000 Encounter for general adult medical examination without abnormal findings: Secondary | ICD-10-CM

## 2012-11-12 DIAGNOSIS — F528 Other sexual dysfunction not due to a substance or known physiological condition: Secondary | ICD-10-CM

## 2012-11-12 LAB — LIPID PANEL
Cholesterol: 228 mg/dL — ABNORMAL HIGH (ref 0–200)
HDL: 53.1 mg/dL (ref >39.00)
Total CHOL/HDL Ratio: 4
Triglycerides: 63 mg/dL (ref 0.0–149.0)
VLDL: 12.6 mg/dL (ref 0.0–40.0)

## 2012-11-12 LAB — CBC WITH DIFFERENTIAL/PLATELET
Basophils Absolute: 0 10*3/uL (ref 0.0–0.1)
Eosinophils Relative: 5.7 % — ABNORMAL HIGH (ref 0.0–5.0)
HCT: 47.1 % (ref 39.0–52.0)
Lymphocytes Relative: 45.3 % (ref 12.0–46.0)
Monocytes Relative: 13.7 % — ABNORMAL HIGH (ref 3.0–12.0)
Neutrophils Relative %: 34 % — ABNORMAL LOW (ref 43.0–77.0)
Platelets: 275 10*3/uL (ref 150.0–400.0)
RDW: 13.1 % (ref 11.5–14.6)
WBC: 3.7 10*3/uL — ABNORMAL LOW (ref 4.5–10.5)

## 2012-11-12 LAB — COMPREHENSIVE METABOLIC PANEL
ALT: 17 U/L (ref 0–53)
Albumin: 3.9 g/dL (ref 3.5–5.2)
CO2: 33 mEq/L — ABNORMAL HIGH (ref 19–32)
Calcium: 9.7 mg/dL (ref 8.4–10.5)
Chloride: 101 mEq/L (ref 96–112)
GFR: 110.21 mL/min (ref >60.00)
Glucose, Bld: 97 mg/dL (ref 70–99)
Sodium: 140 mEq/L (ref 135–145)
Total Bilirubin: 1.2 mg/dL (ref 0.3–1.2)
Total Protein: 7 g/dL (ref 6.0–8.3)

## 2012-11-12 MED ORDER — EZETIMIBE-SIMVASTATIN 10-40 MG PO TABS: 1.0000 | ORAL_TABLET | Freq: Every day | ORAL | Status: DC

## 2012-11-12 MED ORDER — SILDENAFIL CITRATE 100 MG PO TABS: 50.0000 mg | ORAL_TABLET | Freq: Every day | ORAL | Status: DC | PRN

## 2012-11-12 NOTE — Patient Instructions (Signed)
Get your blood work before you leave  Next visit in  6months for a routine office visit Please make an appointment before you leave the office today (or call few weeks in advance) ------- Stop lamisil after 3 months You need blood work again in 1 month to check your liver  ------  Check the  blood pressure 2 or 3 times a week, be sure it is between 110/60 and 140/85. If it is consistently higher or lower, let me know

## 2012-11-12 NOTE — Assessment & Plan Note (Addendum)
Td 2010 had a cscope 10-2011 Diet and  exercise -- doing poorly, discussed need to change life style, nutritionist referral discussed  Chronic medical problems: DM-- poorly controlled, strongly encouraged to d/w endocrinology, aware he is HIGH RISK for complications : stroke, MI, etc states "Dr Everardo All is great, I just need to do better" HTN-- BP well controlled , rec self monitoring  ED-- new problem, trial w/ viagra Onychomycosis-- new problem, on lamisil Rx elesewhere, check LFTs today and in 1 month  cholesterol -- labs, RF  meds  Labs

## 2012-11-12 NOTE — Progress Notes (Signed)
  Subjective:    Patient ID: Troy Becker, male    DOB: October 30, 1960, 52 y.o.   MRN: [id number]  HPI CPX  Past Medical History  Diagnosis Date  . Hypertension   . Diabetes mellitus   . Hyperlipidemia   . Abnormal EKG 2006    w/ a neg stress test   Past Surgical History  Procedure Laterality Date  . Multiple tooth extractions     History   Social History  . Marital Status: Married    Spouse Name: N/A    Number of Children: 2  . Years of Education: N/A   Occupational History  . Project Management (Customer service manager) sedentary    .  [employer]   Social History Main Topics  . Smoking status: Never Smoker   . Smokeless tobacco: Never Used  . Alcohol Use: No  . Drug Use: No  . Sexual Activity: Not on file   Other Topics Concern  . Not on file   Social History Narrative            Family History  Problem Relation Age of Onset  . Coronary artery disease Father     CABG 46  . Hypertension Father   . Hypertension Mother   . Hypertension Sister   . Hypertension Brother   . Colon cancer Neg Hx   . Prostate cancer Neg Hx   . Diabetes Brother     F M     Review of Systems Diet-- not healthy Exercise-- takes walks sometimes   No chest pain or short of breath No nausea, vomiting, diarrhea or blood in the stools. No dysuria or gross hematuria. No anxiety depression. 1 year h/o having difficulty with erections, decrease quality and duration of erections. Went to the urgent care 2 weeks ago after he injured his R great toe,   was prescribed Lamisil for onychomycosis.    Objective:   Physical Exam BP 140/80  Pulse 86  Temp(Src) 98.2 F (36.8 C)  Ht 5' 11.2" (1.808 m)  Wt 238 lb 3.2 oz (108.047 kg)  BMI 33.05 kg/m2  SpO2 99% General -- alert, well-developed, NAD.  Neck --no thyromegaly Lungs -- normal respiratory effort, no intercostal retractions, no accessory muscle use, and normal breath sounds.  Heart-- normal rate, regular rhythm, no  murmur.  Abdomen-- Not distended, good bowel sounds,soft, non-tender. Rectal-- No external abnormalities noted. Normal sphincter tone. No rectal masses or tenderness.   Prostate: Prostate gland firm and smooth, no enlargement, nodularity, tenderness, mass, asymmetry or induration. Extremities-- no pretibial edema bilaterally , normal femoral and pedal pulses DIABETIC FEET EXAM: Pinprick examination of the feet normal. Nails diastrophic (great toes), R great toe w/ resolving hematoma-bruise  Neurologic-- alert & oriented X3. Speech, gait normal.  Psych-- Cognition and judgment appear intact. Alert and cooperative with normal attention span and concentration. not anxious appearing and not depressed appearing.       Assessment & Plan:  In addition to his physical, I spent more than 15 minutes assessing a # of other issues

## 2012-11-13 ENCOUNTER — Telehealth: Payer: Self-pay | Admitting: Internal Medicine

## 2012-11-13 NOTE — Telephone Encounter (Signed)
Advise patient, labs okay except for  cholesterol which is very high. Has he been taking Vytorin every day for the last 2 months? Let me know

## 2012-11-13 NOTE — Telephone Encounter (Signed)
Attempted to contact pt regarding compliance with Vytorin, left message on voice mail for him to return our call.

## 2012-11-15 ENCOUNTER — Other Ambulatory Visit: Payer: Self-pay | Admitting: Endocrinology

## 2012-11-16 ENCOUNTER — Other Ambulatory Visit: Payer: Self-pay | Admitting: *Deleted

## 2012-11-16 MED ORDER — INSULIN PEN NEEDLE 31G X 8 MM MISC: Status: DC

## 2012-11-17 ENCOUNTER — Other Ambulatory Visit: Payer: Self-pay | Admitting: *Deleted

## 2012-11-17 NOTE — Telephone Encounter (Signed)
A prior authorization was started and approved same day Viagra 100 mg pharmacy was notified.   ag cma

## 2012-11-24 ENCOUNTER — Other Ambulatory Visit: Payer: Self-pay | Admitting: Endocrinology

## 2012-11-27 ENCOUNTER — Ambulatory Visit: Payer: BC Managed Care – PPO | Admitting: Endocrinology

## 2012-12-04 ENCOUNTER — Ambulatory Visit: Payer: BC Managed Care – PPO | Admitting: Endocrinology

## 2012-12-06 ENCOUNTER — Other Ambulatory Visit: Payer: Self-pay | Admitting: Internal Medicine

## 2012-12-07 ENCOUNTER — Other Ambulatory Visit: Payer: Self-pay | Admitting: *Deleted

## 2012-12-07 MED ORDER — HYDROCHLOROTHIAZIDE 25 MG PO TABS: ORAL_TABLET | ORAL | Status: DC

## 2012-12-07 NOTE — Telephone Encounter (Signed)
Hydrodiuril refill sent to pharmacy

## 2012-12-15 ENCOUNTER — Other Ambulatory Visit (INDEPENDENT_AMBULATORY_CARE_PROVIDER_SITE_OTHER): Payer: BC Managed Care – PPO

## 2012-12-15 DIAGNOSIS — I10 Essential (primary) hypertension: Secondary | ICD-10-CM

## 2012-12-15 LAB — HEPATIC FUNCTION PANEL
Albumin: 3.9 g/dL (ref 3.5–5.2)
Alkaline Phosphatase: 79 U/L (ref 39–117)
Bilirubin, Direct: 0 mg/dL (ref 0.0–0.3)

## 2013-01-08 ENCOUNTER — Ambulatory Visit (INDEPENDENT_AMBULATORY_CARE_PROVIDER_SITE_OTHER): Payer: BC Managed Care – PPO | Admitting: Endocrinology

## 2013-01-08 ENCOUNTER — Encounter: Payer: Self-pay | Admitting: Endocrinology

## 2013-01-08 VITALS — BP 170/96 | HR 78 | Temp 98.4°F | Ht 71.0 in | Wt 244.9 lb

## 2013-01-08 DIAGNOSIS — E119 Type 2 diabetes mellitus without complications: Secondary | ICD-10-CM

## 2013-01-08 LAB — MICROALBUMIN / CREATININE URINE RATIO
Creatinine,U: 199.1 mg/dL
Microalb Creat Ratio: 7.7 mg/g (ref 0.0–30.0)
Microalb, Ur: 15.4 mg/dL — ABNORMAL HIGH (ref 0.0–1.9)

## 2013-01-08 NOTE — Progress Notes (Signed)
Subjective:    Patient ID: Troy Becker, male    DOB: September 19, 1960, 52 y.o.   MRN: [id number]  HPI Pt returns for f/u of type 2 DM (dx'ed 1999; he has mild if any neuropathy of the lower extremities. no known associated complications; therapy limited by pt's need for a simple regimen--he has chosen qd insulin).  pt states he feels well in general.  no cbg record, but states cbg's vary from 110-266.  It is in general higher as the day goes on.  Past Medical History  Diagnosis Date  . Hypertension   . Diabetes mellitus   . Hyperlipidemia   . Abnormal EKG 2006    w/ a neg stress test    Past Surgical History  Procedure Laterality Date  . Multiple tooth extractions      History   Social History  . Marital Status: Married    Spouse Name: N/A    Number of Children: 2  . Years of Education: N/A   Occupational History  . Project Management (Customer service manager) sedentary    .  [employer]   Social History Main Topics  . Smoking status: Never Smoker   . Smokeless tobacco: Never Used  . Alcohol Use: No  . Drug Use: No  . Sexual Activity: Not on file   Other Topics Concern  . Not on file   Social History Narrative             Current Outpatient Prescriptions on File Prior to Visit  Medication Sig Dispense Refill  . aspirin 81 MG tablet Take 81 mg by mouth daily.        . Blood Glucose Monitoring Suppl (ONE TOUCH ULTRA 2) W/DEVICE KIT 1 Device by Does not apply route once.  1 each  0  . ezetimibe-simvastatin (VYTORIN) 10-40 MG per tablet Take 1 tablet by mouth at bedtime.  30 tablet  6  . glucose blood (ONE TOUCH ULTRA TEST) test strip Use as instructed to check blood sugar twice daily dx 250.02  100 each  5  . hydrochlorothiazide (HYDRODIURIL) 25 MG tablet TAKE 1 TABLET BY MOUTH EVERY MORNING  30 tablet  5  . Insulin Aspart Prot & Aspart (70-30) 100 UNIT/ML SUPN Inject 40 Units into the skin daily with breakfast.       . Insulin Pen Needle (B-D ULTRAFINE III  SHORT PEN) 31G X 8 MM MISC Use as directed by physician  100 each  4  . NOVOLOG MIX 70/30 FLEXPEN (70-30) 100 UNIT/ML SUPN INJECT 10 UNITS UNDER THE SKIN DAILY WITH BREAKFAST  15 mL  0  . ONETOUCH DELICA LANCETS MISC Use as directed to check blood sugar twice daily dx 250.02  100 each  5  . sildenafil (VIAGRA) 100 MG tablet Take 0.5-1 tablets (50-100 mg total) by mouth daily as needed for erectile dysfunction.  3 tablet  3  . Terbinafine HCl (LAMISIL PO) Take by mouth. rx elesewhere       No current facility-administered medications on file prior to visit.   No Known Allergies  Family History  Problem Relation Age of Onset  . Coronary artery disease Father     CABG 25  . Hypertension Father   . Hypertension Mother   . Hypertension Sister   . Hypertension Brother   . Colon cancer Neg Hx   . Prostate cancer Neg Hx   . Diabetes Brother     F M   BP 170/96  Pulse  78  Temp(Src) 98.4 F (36.9 C) (Oral)  Ht 5\' 11"  (1.803 m)  Wt 244 lb 14.4 oz (111.086 kg)  BMI 34.17 kg/m2  SpO2 92%  Review of Systems denies hypoglycemia.  He has gained weight.      Objective:   Physical Exam VITAL SIGNS:  See vs page GENERAL: no distress     Assessment & Plan:  DM: he needs increased rx.  This insulin regimen was chosen from multiple options, for its simplicity.  The benefits of glycemic control must be weighed against the risks of hypoglycemia. He needs increased rx noncompliance with insulin dosing and cbg recording.  i'll do the best i can. Weight gain: this complicates the rx of DM.

## 2013-01-08 NOTE — Patient Instructions (Addendum)
Please increase the insulin to 40 units with breakfast.  blood tests are being requested for you today.  We'll contact you with results. Please come back for a follow-up appointment in 6 weeks.  check your blood sugar twice a day.  vary the time of day when you check, between before the 3 meals, and at bedtime.  also check if you have symptoms of your blood sugar being too high or too low.  please keep a record of the readings and bring it to your next appointment here.  please call us sooner if your blood sugar goes below 70, or if you have a lot of readings over 200.

## 2013-01-17 ENCOUNTER — Other Ambulatory Visit: Payer: Self-pay | Admitting: Endocrinology

## 2013-02-19 ENCOUNTER — Encounter: Payer: Self-pay | Admitting: Endocrinology

## 2013-02-19 ENCOUNTER — Ambulatory Visit (INDEPENDENT_AMBULATORY_CARE_PROVIDER_SITE_OTHER): Payer: BC Managed Care – PPO | Admitting: Endocrinology

## 2013-02-19 VITALS — BP 116/66 | HR 84 | Temp 99.0°F | Ht 71.0 in | Wt 242.0 lb

## 2013-02-19 DIAGNOSIS — E119 Type 2 diabetes mellitus without complications: Secondary | ICD-10-CM

## 2013-02-19 NOTE — Patient Instructions (Addendum)
Please increase the insulin to 50 units with breakfast.  However, if you are going to be active, take just 30 units.   blood tests are being requested for you today.  We'll contact you with results. Please come back for a follow-up appointment in 6 weeks.  check your blood sugar twice a day.  vary the time of day when you check, between before the 3 meals, and at bedtime.  also check if you have symptoms of your blood sugar being too high or too low.  please keep a record of the readings and bring it to your next appointment here.  please call us sooner if your blood sugar goes below 70, or if you have a lot of readings over 200.

## 2013-02-19 NOTE — Progress Notes (Signed)
Subjective:    Patient ID: Troy Becker, male    DOB: 28-Oct-1960, 52 y.o.   MRN: [id number]  HPI Pt returns for f/u of type 2 DM (dx'ed 1999; he has mild if any neuropathy of the lower extremities. no known associated complications; therapy limited by pt's need for a simple regimen--he has chosen qd insulin).  pt states he feels well in general.  no cbg record, but states cbg's vary from 82 (with activity) to 250.  It is in general higher as the day goes on.  pt states he feels well in general. Past Medical History  Diagnosis Date  . Hypertension   . Diabetes mellitus   . Hyperlipidemia   . Abnormal EKG 2006    w/ a neg stress test    Past Surgical History  Procedure Laterality Date  . Multiple tooth extractions      History   Social History  . Marital Status: Married    Spouse Name: N/A    Number of Children: 2  . Years of Education: N/A   Occupational History  . Project Management (Customer service manager) sedentary    .  [employer]   Social History Main Topics  . Smoking status: Never Smoker   . Smokeless tobacco: Never Used  . Alcohol Use: No  . Drug Use: No  . Sexual Activity: Not on file   Other Topics Concern  . Not on file   Social History Narrative             Current Outpatient Prescriptions on File Prior to Visit  Medication Sig Dispense Refill  . aspirin 81 MG tablet Take 81 mg by mouth daily.        . Blood Glucose Monitoring Suppl (ONE TOUCH ULTRA 2) W/DEVICE KIT 1 Device by Does not apply route once.  1 each  0  . ezetimibe-simvastatin (VYTORIN) 10-40 MG per tablet Take 1 tablet by mouth at bedtime.  30 tablet  6  . glucose blood (ONE TOUCH ULTRA TEST) test strip Use as instructed to check blood sugar twice daily dx 250.02  100 each  5  . hydrochlorothiazide (HYDRODIURIL) 25 MG tablet TAKE 1 TABLET BY MOUTH EVERY MORNING  30 tablet  5  . Insulin Pen Needle (B-D ULTRAFINE III SHORT PEN) 31G X 8 MM MISC Use as directed by physician  100  each  4  . ONETOUCH DELICA LANCETS MISC Use as directed to check blood sugar twice daily dx 250.02  100 each  5  . sildenafil (VIAGRA) 100 MG tablet Take 0.5-1 tablets (50-100 mg total) by mouth daily as needed for erectile dysfunction.  3 tablet  3  . Terbinafine HCl (LAMISIL PO) Take by mouth. rx elesewhere       No current facility-administered medications on file prior to visit.    No Known Allergies  Family History  Problem Relation Age of Onset  . Coronary artery disease Father     CABG 32  . Hypertension Father   . Hypertension Mother   . Hypertension Sister   . Hypertension Brother   . Colon cancer Neg Hx   . Prostate cancer Neg Hx   . Diabetes Brother     F M    BP 116/66  Pulse 84  Temp(Src) 99 F (37.2 C) (Oral)  Ht 5\' 11"  (1.803 m)  Wt 242 lb (109.77 kg)  BMI 33.77 kg/m2  SpO2 96%  Review of Systems denies hypoglycemia.  He  has weight gain.    Objective:   Physical Exam VITAL SIGNS:  See vs page GENERAL: no distress . Lab Results  Component Value Date   HGBA1C 11.4* 01/08/2013      Assessment & Plan:  DM: he needs increased rx.  This insulin regimen was chosen from multiple options, for its simplicity.  The benefits of glycemic control must be weighed against the risks of hypoglycemia. He needs increased rx noncompliance with insulin dosing and cbg recording.  i'll do the best i can.

## 2013-04-02 ENCOUNTER — Ambulatory Visit: Payer: BC Managed Care – PPO | Admitting: Endocrinology

## 2013-04-09 ENCOUNTER — Ambulatory Visit: Payer: BC Managed Care – PPO | Admitting: Endocrinology

## 2013-04-23 ENCOUNTER — Ambulatory Visit: Payer: BC Managed Care – PPO | Admitting: Endocrinology

## 2013-05-12 ENCOUNTER — Ambulatory Visit: Payer: BC Managed Care – PPO | Admitting: Internal Medicine

## 2013-05-18 ENCOUNTER — Ambulatory Visit: Payer: BC Managed Care – PPO | Admitting: Internal Medicine

## 2013-05-21 ENCOUNTER — Other Ambulatory Visit: Payer: Self-pay | Admitting: Internal Medicine

## 2013-06-03 ENCOUNTER — Encounter: Payer: Self-pay | Admitting: Internal Medicine

## 2013-06-03 ENCOUNTER — Ambulatory Visit (INDEPENDENT_AMBULATORY_CARE_PROVIDER_SITE_OTHER): Payer: BC Managed Care – PPO | Admitting: Internal Medicine

## 2013-06-03 ENCOUNTER — Ambulatory Visit: Payer: BC Managed Care – PPO | Admitting: Endocrinology

## 2013-06-03 VITALS — BP 152/78 | HR 76 | Temp 97.9°F | Wt 242.0 lb

## 2013-06-03 DIAGNOSIS — I10 Essential (primary) hypertension: Secondary | ICD-10-CM

## 2013-06-03 DIAGNOSIS — B351 Tinea unguium: Secondary | ICD-10-CM | POA: Insufficient documentation

## 2013-06-03 DIAGNOSIS — E785 Hyperlipidemia, unspecified: Secondary | ICD-10-CM

## 2013-06-03 DIAGNOSIS — E119 Type 2 diabetes mellitus without complications: Secondary | ICD-10-CM

## 2013-06-03 MED ORDER — LOSARTAN POTASSIUM-HCTZ 100-12.5 MG PO TABS: 1.0000 | ORAL_TABLET | Freq: Every day | ORAL | Status: DC

## 2013-06-03 NOTE — Progress Notes (Signed)
Pre visit review using our clinic review tool, if applicable. No additional management support is needed unless otherwise documented below in the visit note. 

## 2013-06-03 NOTE — Assessment & Plan Note (Signed)
Per endocrinology, healthy lifestyle encouraged

## 2013-06-03 NOTE — Assessment & Plan Note (Signed)
Dystrophic nails, status post Lamisil, no improvement. Dermatology referral

## 2013-06-03 NOTE — Patient Instructions (Signed)
Stop hydrochlorothiazide Start losartan HCT one tablet daily in the morning  Schedule labs to be done 2 weeks from now (fasting): FLP, AST, ALT--- dx  high cholesterol BMP--- dx  hypertension  Check the  blood pressure 2 or 3 times a  week be sure it is between 110/60 and 140/85. Ideal blood pressure is 120/80. If it is consistently higher or lower, let me know   diet-exercise!!  [facility] web site for Diabetes https://www.cervantes-rivera.net/  If you need more information about a healthy diet,   diabetes, hypertension visit  the American Heart Association, it  is a great resource online at:  http://www.richard-flynn.net/    Next visit is for a physical exam in 5 months , fasting Please make an appointment

## 2013-06-03 NOTE — Progress Notes (Signed)
Subjective:    Patient ID: Troy Becker, male    DOB: 03-16-1960, 53 y.o.   MRN: [id number]  DOS:  06/03/2013 Type of  visit: ROV Diabetes, per endocrinology, admits  is not doing a good job with diet and exercise. Onychomycoses? Finished Lamisil,   no improvement. High cholesterol, he was not taking Vytorin when we last checked, he is taking it now. Hypertension, on hydrochlorothiazide, ambulatory BPs range from 130, 160. D-BP in the 80s.      ROS  Denies chest pain or difficulty breathing. No  nausea, vomiting, diarrhea. No lower extremity edema.  Past Medical History  Diagnosis Date  . Hypertension   . Diabetes mellitus   . Hyperlipidemia   . Abnormal EKG 2006    w/ a neg stress test    Past Surgical History  Procedure Laterality Date  . Multiple tooth extractions      History   Social History  . Marital Status: Married    Spouse Name: N/A    Number of Children: 2  . Years of Education: N/A   Occupational History  . Project Management (Hospital doctor) sedentary    .  [employer]   Social History Main Topics  . Smoking status: Never Smoker   . Smokeless tobacco: Never Used  . Alcohol Use: No  . Drug Use: No  . Sexual Activity: Not on file   Other Topics Concern  . Not on file   Social History Narrative                 Medication List       This list is accurate as of: 06/03/13  1:52 PM.  Always use your most recent med list.               aspirin 81 MG tablet  Take 81 mg by mouth daily.     ezetimibe-simvastatin 10-40 MG per tablet  Commonly known as:  VYTORIN  Take 1 tablet by mouth at bedtime.     Insulin Pen Needle 31G X 8 MM Misc  Commonly known as:  B-D ULTRAFINE III SHORT PEN  Use as directed by physician     losartan-hydrochlorothiazide 100-12.5 MG per tablet  Commonly known as:  HYZAAR  Take 1 tablet by mouth daily.     NOVOLOG MIX 70/30 FLEXPEN (70-30) 100 UNIT/ML Pen  Generic drug:  Insulin Aspart Prot &  Aspart  INJECT 50 UNITS UNDER THE SKIN DAILY WITH BREAKFAST     ONE TOUCH ULTRA 2 W/DEVICE Kit  1 Device by Does not apply route once.     ONETOUCH DELICA LANCETS Misc  Use as directed to check blood sugar twice daily dx 250.02     sildenafil 100 MG tablet  Commonly known as:  VIAGRA  Take 0.5-1 tablets (50-100 mg total) by mouth daily as needed for erectile dysfunction.           Objective:   Physical Exam BP 152/78  Pulse 76  Temp(Src) 97.9 F (36.6 C)  Wt 242 lb (109.77 kg)  SpO2 99%  General -- alert, well-developed, NAD.  Lungs -- normal respiratory effort, no intercostal retractions, no accessory muscle use, and normal breath sounds.  Heart-- normal rate, regular rhythm, no murmur.  Extremities-- no pretibial edema bilaterally  Normal pedal pulses bilaterally Quite dystrophic great toenails, worse on the right Neurologic--  alert & oriented X3. Speech normal, gait normal, strength normal in all extremities.  Psych-- Cognition  and judgment appear intact. Cooperative with normal attention span and concentration. No anxious or depressed appearing.       Assessment & Plan:

## 2013-06-03 NOTE — Assessment & Plan Note (Signed)
Last cholesterol panel showed increased cholesterol, he was not taking Vytorin, he is taking it now. Labs in 2 weeks, see instructions

## 2013-06-03 NOTE — Assessment & Plan Note (Addendum)
Systolic BP sometimes in the 160s, needs better control. Discontinue hydrochlorothiazide, start losartan HCT, see instructions He will follow up in 5 months, strongly encouraged   to call me sooner if BP is not well-controlled

## 2013-06-04 ENCOUNTER — Other Ambulatory Visit: Payer: Self-pay | Admitting: Internal Medicine

## 2013-06-15 ENCOUNTER — Ambulatory Visit: Payer: BC Managed Care – PPO | Admitting: Endocrinology

## 2013-06-18 ENCOUNTER — Other Ambulatory Visit: Payer: BC Managed Care – PPO

## 2013-06-21 ENCOUNTER — Telehealth: Payer: Self-pay

## 2013-06-21 NOTE — Telephone Encounter (Signed)
Relevant patient education assigned to patient using Emmi. ° °

## 2013-07-02 ENCOUNTER — Telehealth: Payer: Self-pay | Admitting: Internal Medicine

## 2013-07-02 NOTE — Telephone Encounter (Signed)
Advise patient, he is due for labs ordered for 06-03-13: FLP, AST, ALT--- dx high cholesterol  BMP--- dx hypertension Please arrange if pt agreeable

## 2013-07-02 NOTE — Telephone Encounter (Signed)
Pt scheduled for next Monday

## 2013-07-05 ENCOUNTER — Other Ambulatory Visit (INDEPENDENT_AMBULATORY_CARE_PROVIDER_SITE_OTHER): Payer: BC Managed Care – PPO

## 2013-07-05 DIAGNOSIS — I10 Essential (primary) hypertension: Secondary | ICD-10-CM

## 2013-07-05 DIAGNOSIS — E7800 Pure hypercholesterolemia, unspecified: Secondary | ICD-10-CM

## 2013-07-05 LAB — BASIC METABOLIC PANEL
BUN: 19 mg/dL (ref 6–23)
CALCIUM: 9.5 mg/dL (ref 8.4–10.5)
CO2: 32 mEq/L (ref 19–32)
Chloride: 97 mEq/L (ref 96–112)
Creatinine, Ser: 1.2 mg/dL (ref 0.4–1.5)
GFR: 77.91 mL/min (ref >60.00)
GLUCOSE: 218 mg/dL — AB (ref 70–99)
Potassium: 3.8 mEq/L (ref 3.5–5.1)
Sodium: 137 mEq/L (ref 135–145)

## 2013-07-05 LAB — ALT: ALT: 20 U/L (ref 0–53)

## 2013-07-05 LAB — LIPID PANEL
CHOL/HDL RATIO: 2
Cholesterol: 106 mg/dL (ref 0–200)
HDL: 42.9 mg/dL (ref >39.00)
LDL CALC: 22 mg/dL (ref 0–99)
Triglycerides: 208 mg/dL — ABNORMAL HIGH (ref 0.0–149.0)
VLDL: 41.6 mg/dL — ABNORMAL HIGH (ref 0.0–40.0)

## 2013-07-05 LAB — AST: AST: 17 U/L (ref 0–37)

## 2013-07-07 ENCOUNTER — Telehealth: Payer: Self-pay

## 2013-07-07 NOTE — Telephone Encounter (Signed)
Discussed with patient and he voiced understanding of Dr.Paz's recommendations, he will follow up in Sept and his apt is already scheduled. His BP is fluctuating and the Diastolic is much better, but he is not checking his BP often. I have made him aware to continue to check his BP and report any elevation, he has agreed. No complaints or further questions, he will review his labs on my-chart and if he has any question or concerns he will call back.      KP

## 2013-07-07 NOTE — Telephone Encounter (Signed)
Message copied by Ewing Schlein on Wed Jul 07, 2013  3:08 PM ------      Message from: Kathlene November E      Created: Wed Jul 07, 2013  1:58 PM       Advise patient      Cholesterol definitely improved.      He started losartan HCT, potassium remains normal, creatinine slightly elevated, needs to be recheck when she comes back.      Ask  him about his blood pressure --- if is  not well-controlled let me know ------

## 2013-10-06 ENCOUNTER — Other Ambulatory Visit: Payer: Self-pay | Admitting: Internal Medicine

## 2013-10-19 ENCOUNTER — Other Ambulatory Visit: Payer: Self-pay

## 2013-10-19 MED ORDER — INSULIN ASPART PROT & ASPART (70-30 MIX) 100 UNIT/ML PEN: PEN_INJECTOR | SUBCUTANEOUS | Status: DC

## 2013-11-05 ENCOUNTER — Ambulatory Visit (INDEPENDENT_AMBULATORY_CARE_PROVIDER_SITE_OTHER): Payer: BC Managed Care – PPO | Admitting: Internal Medicine

## 2013-11-05 ENCOUNTER — Encounter: Payer: Self-pay | Admitting: Internal Medicine

## 2013-11-05 VITALS — BP 128/68 | HR 68 | Temp 98.2°F | Ht 71.0 in | Wt 240.3 lb

## 2013-11-05 DIAGNOSIS — I10 Essential (primary) hypertension: Secondary | ICD-10-CM

## 2013-11-05 DIAGNOSIS — E119 Type 2 diabetes mellitus without complications: Secondary | ICD-10-CM

## 2013-11-05 LAB — BASIC METABOLIC PANEL
BUN: 15 mg/dL (ref 6–23)
CALCIUM: 9.3 mg/dL (ref 8.4–10.5)
CO2: 35 mEq/L — ABNORMAL HIGH (ref 19–32)
CREATININE: 1.2 mg/dL (ref 0.4–1.5)
Chloride: 95 mEq/L — ABNORMAL LOW (ref 96–112)
GFR: 80.81 mL/min (ref >60.00)
Glucose, Bld: 245 mg/dL — ABNORMAL HIGH (ref 70–99)
Potassium: 3.8 mEq/L (ref 3.5–5.1)
SODIUM: 137 meq/L (ref 135–145)

## 2013-11-05 LAB — TSH: TSH: 1.52 u[IU]/mL (ref 0.35–4.50)

## 2013-11-05 MED ORDER — LOSARTAN POTASSIUM-HCTZ 100-12.5 MG PO TABS: ORAL_TABLET | ORAL | Status: DC

## 2013-11-05 MED ORDER — EZETIMIBE-SIMVASTATIN 10-40 MG PO TABS: ORAL_TABLET | ORAL | Status: DC

## 2013-11-05 NOTE — Patient Instructions (Signed)
Get your blood work before you leave      Please come back to the office in 3 months  Stop by the front desk and schedule the visit   Reason for the visit: physical exam, fasting

## 2013-11-05 NOTE — Assessment & Plan Note (Signed)
Well-controlled, check a BMP and TSH, continue with present care

## 2013-11-05 NOTE — Assessment & Plan Note (Signed)
We again discussed the role of a healthy diet and exercise in controlling his diabetes, w/o a change in his habits, diabetes will remain very difficult to control. Again risks discussed. "My brother is a psychiatrist, he already told me I'll die young if i don't change". Plans to see endocrinology soon

## 2013-11-05 NOTE — Progress Notes (Signed)
Subjective:    Patient ID: Troy Becker, male    DOB: 02/15/1961, 53 y.o.   MRN: [id number]  DOS:  11/05/2013 Type of visit - description : rov Interval history: Feels well In general, having some stress related to his Job. Medication list reviewed, good compliance. CBGs on 160s in the mornings Ambulatory BPs never more than 140s/82.    ROS Denies chest pain, difficulty breathing. No nausea, vomiting, diarrhea No blurred vision or excessive thirst. Admits to very poor eating, chips, doughnuts, etc. Not exercising much   Past Medical History  Diagnosis Date  . Hypertension   . Diabetes mellitus   . Hyperlipidemia   . Abnormal EKG 2006    w/ a neg stress test    Past Surgical History  Procedure Laterality Date  . Multiple tooth extractions      History   Social History  . Marital Status: Married    Spouse Name: N/A    Number of Children: 2  . Years of Education: N/A   Occupational History  . Project Management (Hospital doctor) sedentary    .  [employer]   Social History Main Topics  . Smoking status: Never Smoker   . Smokeless tobacco: Never Used  . Alcohol Use: No  . Drug Use: No  . Sexual Activity: Not on file   Other Topics Concern  . Not on file   Social History Narrative                 Medication List       This list is accurate as of: 11/05/13 11:59 PM.  Always use your most recent med list.               aspirin 81 MG tablet  Take 81 mg by mouth daily.     ezetimibe-simvastatin 10-40 MG per tablet  Commonly known as:  VYTORIN  TAKE 1 TABLET BY MOUTH AT BEDTIME     Insulin Aspart Prot & Aspart (70-30) 100 UNIT/ML Pen  Commonly known as:  NOVOLOG MIX 70/30 FLEXPEN  INJECT 50 UNITS UNDER THE SKIN DAILY WITH BREAKFAST     Insulin Pen Needle 31G X 8 MM Misc  Commonly known as:  B-D ULTRAFINE III SHORT PEN  Use as directed by physician     losartan-hydrochlorothiazide 100-12.5 MG per tablet  Commonly known as:   HYZAAR  TAKE 1 TABLET BY MOUTH DAILY     ONE TOUCH ULTRA 2 W/DEVICE Kit  1 Device by Does not apply route once.     ONETOUCH DELICA LANCETS Misc  Use as directed to check blood sugar twice daily dx 250.02     sildenafil 100 MG tablet  Commonly known as:  VIAGRA  Take 0.5-1 tablets (50-100 mg total) by mouth daily as needed for erectile dysfunction.           Objective:   Physical Exam BP 128/68  Pulse 68  Temp(Src) 98.2 F (36.8 C) (Oral)  Ht _0  (1.803 m)  Wt 240 lb 4.8 oz (109 kg)  BMI 33.53 kg/m2  SpO2 98% General -- alert, well-developed, NAD.  Lungs -- normal respiratory effort, no intercostal retractions, no accessory muscle use, and normal breath sounds.  Heart-- normal rate, regular rhythm, no murmur.  Extremities-- no pretibial edema bilaterally  Neurologic--  alert & oriented X3. Speech normal, gait appropriate for age, strength symmetric and appropriate for age.  Psych-- Cognition and judgment appear intact. Cooperative with normal  attention span and concentration. No anxious or depressed appearing.        Assessment & Plan:

## 2013-11-05 NOTE — Progress Notes (Signed)
Pre visit review using our clinic review tool, if applicable. No additional management support is needed unless otherwise documented below in the visit note. 

## 2013-12-14 ENCOUNTER — Other Ambulatory Visit: Payer: Self-pay | Admitting: Endocrinology

## 2013-12-15 ENCOUNTER — Other Ambulatory Visit: Payer: Self-pay | Admitting: Endocrinology

## 2013-12-15 NOTE — Telephone Encounter (Signed)
Rx sent to pharmacy   

## 2013-12-15 NOTE — Telephone Encounter (Signed)
Please refill x 1 Ov is due  

## 2013-12-15 NOTE — Telephone Encounter (Signed)
Please advise if ok to refill. Pt has not been seen since 02/14/2013. Thanks!

## 2014-01-11 ENCOUNTER — Other Ambulatory Visit: Payer: Self-pay | Admitting: Endocrinology

## 2014-01-12 NOTE — Telephone Encounter (Signed)
Please refill x 1 Ov is due  

## 2014-01-12 NOTE — Telephone Encounter (Signed)
Please advise if ok to refill pt has not been seen since 02/2013. Thanks!

## 2014-01-12 NOTE — Telephone Encounter (Signed)
Rx sent to pharmacy   

## 2014-02-09 ENCOUNTER — Ambulatory Visit: Payer: BC Managed Care – PPO | Admitting: Internal Medicine

## 2014-02-15 ENCOUNTER — Other Ambulatory Visit: Payer: Self-pay | Admitting: Internal Medicine

## 2014-02-18 ENCOUNTER — Encounter: Payer: Self-pay | Admitting: Internal Medicine

## 2014-02-18 ENCOUNTER — Ambulatory Visit (INDEPENDENT_AMBULATORY_CARE_PROVIDER_SITE_OTHER): Payer: BC Managed Care – PPO | Admitting: Internal Medicine

## 2014-02-18 VITALS — BP 175/76 | HR 84 | Temp 98.3°F | Wt 245.5 lb

## 2014-02-18 DIAGNOSIS — F419 Anxiety disorder, unspecified: Secondary | ICD-10-CM

## 2014-02-18 NOTE — Progress Notes (Signed)
Subjective:    Patient ID: Troy Becker, male    DOB: 05/01/1960, 53 y.o.   MRN: [id number]  DOS:  02/18/2014 Type of visit - description : f/u Interval history: Was here for a follow-up but shortly after the visit started he become quite emotional. He learned few months ago that his job ends by March 2016 and has been very stressed since . In addition he has some marital, financial issues. Feels overwhelmed however is already taken steps to get a new job.    ROS Denies any suicidal or homicidal ideas. "I am a man of faith".   Past Medical History  Diagnosis Date  . Hypertension   . Diabetes mellitus   . Hyperlipidemia   . Abnormal EKG 2006    w/ a neg stress test    Past Surgical History  Procedure Laterality Date  . Multiple tooth extractions      History   Social History  . Marital Status: Married    Spouse Name: N/A    Number of Children: 2  . Years of Education: N/A   Occupational History  . Project Management (Hospital doctor) sedentary    .  [employer]   Social History Main Topics  . Smoking status: Never Smoker   . Smokeless tobacco: Never Used  . Alcohol Use: No  . Drug Use: No  . Sexual Activity: Not on file   Other Topics Concern  . Not on file   Social History Narrative                 Medication List       This list is accurate as of: 02/18/14 11:59 PM.  Always use your most recent med list.               aspirin 81 MG tablet  Take 81 mg by mouth daily.     B-D ULTRAFINE III SHORT PEN 31G X 8 MM Misc  Generic drug:  Insulin Pen Needle  USE AS DIRECTED     ezetimibe-simvastatin 10-40 MG per tablet  Commonly known as:  VYTORIN  TAKE 1 TABLET BY MOUTH AT BEDTIME     Insulin Aspart Prot & Aspart (70-30) 100 UNIT/ML Pen  Commonly known as:  NOVOLOG MIX 70/30 FLEXPEN  Inject 50 units every morning with breakfast. NEEDS APPOINTMENT FOR FURTHER REFILLS.     losartan-hydrochlorothiazide 100-12.5 MG per tablet   Commonly known as:  HYZAAR  TAKE 1 TABLET BY MOUTH DAILY     ONE TOUCH ULTRA 2 W/DEVICE Kit  1 Device by Does not apply route once.     ONETOUCH DELICA LANCETS Misc  Use as directed to check blood sugar twice daily dx 250.02     sildenafil 100 MG tablet  Commonly known as:  VIAGRA  Take 0.5-1 tablets (50-100 mg total) by mouth daily as needed for erectile dysfunction.     terbinafine 1 % cream  Commonly known as:  LAMISIL  Apply 1 application topically at bedtime. PRN           Objective:   Physical Exam BP 175/76 mmHg  Pulse 84  Temp(Src) 98.3 F (36.8 C) (Oral)  Wt 245 lb 8 oz (111.358 kg)  SpO2 96%  General -- alert, well-developed, tearful, + emotional distress   But cognition and judgment appear intact. Cooperative with normal attention span and concentration.  Neurologic--  alert & oriented X3. Speech normal, gait appropriate for age, strength symmetric and  appropriate for age.       Assessment & Plan:  Today , I spent more than  25  min with the patient: >50% of the time counseling regards what is anxiety, why he shouldn't be ashame of expressing his feelings; also listening and telling him the benefits of counseling.   BP slightly elevated, will recheck on return to the office. See instructions

## 2014-02-18 NOTE — Patient Instructions (Signed)
Please think about counseling If you feel you need medication please call us Come back in 2-3 weeks See Dr Loanne Drilling ASAP

## 2014-02-18 NOTE — Progress Notes (Signed)
Pre visit review using our clinic review tool, if applicable. No additional management support is needed unless otherwise documented below in the visit note. 

## 2014-02-20 NOTE — Assessment & Plan Note (Signed)
Due to a number of issues in his life he is very anxious, I counseled today about his situation: He is already taken steps to find a new job, I told him the important role of counseling and possibly medication. He elected counseling and plans to call our office and set up an appointment. Declined medication for now.

## 2014-03-15 ENCOUNTER — Other Ambulatory Visit: Payer: Self-pay | Admitting: Internal Medicine

## 2014-03-15 ENCOUNTER — Other Ambulatory Visit: Payer: Self-pay | Admitting: Endocrinology

## 2014-03-16 NOTE — Telephone Encounter (Signed)
Please advise if ok to refill rx. Pt has not been seen since 02/14/2013. Thanks!

## 2014-03-16 NOTE — Telephone Encounter (Signed)
Please refill x 1 Ov due

## 2014-03-16 NOTE — Telephone Encounter (Signed)
Rx sent 

## 2014-04-08 ENCOUNTER — Encounter: Payer: Self-pay | Admitting: Endocrinology

## 2014-04-08 ENCOUNTER — Ambulatory Visit (INDEPENDENT_AMBULATORY_CARE_PROVIDER_SITE_OTHER): Payer: BLUE CROSS/BLUE SHIELD | Admitting: Endocrinology

## 2014-04-08 VITALS — BP 132/88 | HR 83 | Temp 99.9°F | Ht 71.0 in | Wt 246.0 lb

## 2014-04-08 DIAGNOSIS — E119 Type 2 diabetes mellitus without complications: Secondary | ICD-10-CM

## 2014-04-08 LAB — MICROALBUMIN / CREATININE URINE RATIO
CREATININE, U: 149.3 mg/dL
MICROALB UR: 37.2 mg/dL — AB (ref 0.0–1.9)
Microalb Creat Ratio: 24.9 mg/g (ref 0.0–30.0)

## 2014-04-08 LAB — HEMOGLOBIN A1C: Hgb A1c MFr Bld: 14 % — ABNORMAL HIGH (ref 4.6–6.5)

## 2014-04-08 MED ORDER — INSULIN ASPART PROT & ASPART (70-30 MIX) 100 UNIT/ML PEN: 50.0000 [IU] | PEN_INJECTOR | Freq: Every day | SUBCUTANEOUS | Status: DC

## 2014-04-08 NOTE — Patient Instructions (Addendum)
Please increase the insulin to 50 units with breakfast.  However, if you are going to be active, take just 30 units.   Blood and urine tests are being requested for you today.  We'll contact you with results. Please come back for a follow-up appointment in 6 weeks.  check your blood sugar twice a day.  vary the time of day when you check, between before the 3 meals, and at bedtime.  also check if you have symptoms of your blood sugar being too high or too low.  please keep a record of the readings and bring it to your next appointment here.  please call us sooner if your blood sugar goes below 70, or if you have a lot of readings over 200.

## 2014-04-08 NOTE — Progress Notes (Signed)
Subjective:    Patient ID: Troy Becker, male    DOB: Aug 01, 1960, 54 y.o.   MRN: [id number]  HPI  Pt returns for f/u of diabetes mellitus: DM type: Insulin-requiring type 2 Dx'ed: 2122 Complications: none Therapy: insulin DKA: never Severe hypoglycemia: never Pancreatitis: never Other: he has chosen qd insulin Interval history: no cbg record, but states cbg's are in the 200's.  He seldom checks cbg's.  pt states he feels well in general.  Pt says he never misses the insulin altogether.  He takes just 40 units qd. Past Medical History  Diagnosis Date  . Hypertension   . Diabetes mellitus   . Hyperlipidemia   . Abnormal EKG 2006    w/ a neg stress test    Past Surgical History  Procedure Laterality Date  . Multiple tooth extractions      History   Social History  . Marital Status: Married    Spouse Name: N/A    Number of Children: 2  . Years of Education: N/A   Occupational History  . Project Management (Hospital doctor) sedentary    .  [employer]   Social History Main Topics  . Smoking status: Never Smoker   . Smokeless tobacco: Never Used  . Alcohol Use: No  . Drug Use: No  . Sexual Activity: Not on file   Other Topics Concern  . Not on file   Social History Narrative             Current Outpatient Prescriptions on File Prior to Visit  Medication Sig Dispense Refill  . aspirin 81 MG tablet Take 81 mg by mouth daily.      . Blood Glucose Monitoring Suppl (ONE TOUCH ULTRA 2) W/DEVICE KIT 1 Device by Does not apply route once. 1 each 0  . ezetimibe-simvastatin (VYTORIN) 10-40 MG per tablet TAKE 1 TABLET BY MOUTH AT BEDTIME 90 tablet 2  . Insulin Pen Needle (B-D ULTRAFINE III SHORT PEN) 31G X 8 MM MISC Use as directed. APPOINTMENT NEEDED FOR FURTHER REFILLS 100 each 0  . losartan-hydrochlorothiazide (HYZAAR) 100-12.5 MG per tablet TAKE 1 TABLET BY MOUTH DAILY 90 tablet 2  . ONETOUCH DELICA LANCETS MISC Use as directed to check blood sugar  twice daily dx 250.02 100 each 5  . sildenafil (VIAGRA) 100 MG tablet Take 0.5-1 tablets (50-100 mg total) by mouth daily as needed for erectile dysfunction. 3 tablet 3  . terbinafine (LAMISIL) 1 % cream Apply 1 application topically at bedtime. PRN     No current facility-administered medications on file prior to visit.    No Known Allergies  Family History  Problem Relation Age of Onset  . Coronary artery disease Father     CABG 62  . Hypertension Father   . Hypertension Mother   . Hypertension Sister   . Hypertension Brother   . Colon cancer Neg Hx   . Prostate cancer Neg Hx   . Diabetes Brother     F M    BP 132/88 mmHg  Pulse 83  Temp(Src) 99.9 F (37.7 C) (Oral)  Ht _0  (1.803 m)  Wt 246 lb (111.585 kg)  BMI 34.33 kg/m2  SpO2 99%   Review of Systems He denies hypoglycemia.  He reports chronic weight gain    Objective:   Physical Exam VITAL SIGNS:  See vs page GENERAL: no distress Pulses: dorsalis pedis intact bilat.   MSK: no deformity of the feet CV: 1+ bilat leg  edema. Skin:  no ulcer on the feet.  normal color and temp on the feet. Neuro: sensation is intact to touch on the feet. Ext: There is bilateral onychomycosis of the toenails.  Lab Results  Component Value Date   HGBA1C 14.0 Repeated and verified X2.* 04/08/2014       Assessment & Plan:  DM: severe exacerbation Noncompliance with cbg recording, insulin dosing, and f/o ov's, persistent: I'll work around this as best I can. Weight gain: pt is advised to re-lose.   Patient is advised the following: Patient Instructions  Please increase the insulin to 50 units with breakfast.  However, if you are going to be active, take just 30 units.   Blood and urine tests are being requested for you today.  We'll contact you with results. Please come back for a follow-up appointment in 6 weeks.  check your blood sugar twice a day.  vary the time of day when you check, between before the 3 meals, and  at bedtime.  also check if you have symptoms of your blood sugar being too high or too low.  please keep a record of the readings and bring it to your next appointment here.  please call us sooner if your blood sugar goes below 70, or if you have a lot of readings over 200.       addendum: increase the insulin to 70 units with breakfast.

## 2014-04-14 ENCOUNTER — Encounter: Payer: Self-pay | Admitting: Medical

## 2014-04-21 ENCOUNTER — Other Ambulatory Visit: Payer: Self-pay | Admitting: Endocrinology

## 2014-05-20 ENCOUNTER — Ambulatory Visit: Payer: BLUE CROSS/BLUE SHIELD | Admitting: Endocrinology

## 2014-05-23 ENCOUNTER — Encounter: Payer: Self-pay | Admitting: Endocrinology

## 2014-05-27 ENCOUNTER — Ambulatory Visit: Payer: BLUE CROSS/BLUE SHIELD | Admitting: Endocrinology

## 2014-05-31 ENCOUNTER — Ambulatory Visit (INDEPENDENT_AMBULATORY_CARE_PROVIDER_SITE_OTHER): Payer: BLUE CROSS/BLUE SHIELD | Admitting: Licensed Clinical Social Worker

## 2014-05-31 DIAGNOSIS — F322 Major depressive disorder, single episode, severe without psychotic features: Secondary | ICD-10-CM | POA: Diagnosis not present

## 2014-06-01 ENCOUNTER — Ambulatory Visit: Payer: BLUE CROSS/BLUE SHIELD | Admitting: Internal Medicine

## 2014-06-14 ENCOUNTER — Ambulatory Visit (INDEPENDENT_AMBULATORY_CARE_PROVIDER_SITE_OTHER): Payer: BLUE CROSS/BLUE SHIELD | Admitting: Licensed Clinical Social Worker

## 2014-06-14 DIAGNOSIS — F322 Major depressive disorder, single episode, severe without psychotic features: Secondary | ICD-10-CM

## 2014-06-28 ENCOUNTER — Ambulatory Visit (INDEPENDENT_AMBULATORY_CARE_PROVIDER_SITE_OTHER): Payer: BLUE CROSS/BLUE SHIELD | Admitting: Licensed Clinical Social Worker

## 2014-06-28 DIAGNOSIS — F322 Major depressive disorder, single episode, severe without psychotic features: Secondary | ICD-10-CM | POA: Diagnosis not present

## 2014-07-03 ENCOUNTER — Other Ambulatory Visit: Payer: Self-pay | Admitting: Endocrinology

## 2014-07-14 ENCOUNTER — Ambulatory Visit (INDEPENDENT_AMBULATORY_CARE_PROVIDER_SITE_OTHER): Payer: BLUE CROSS/BLUE SHIELD | Admitting: Licensed Clinical Social Worker

## 2014-07-14 DIAGNOSIS — F322 Major depressive disorder, single episode, severe without psychotic features: Secondary | ICD-10-CM

## 2014-07-28 ENCOUNTER — Ambulatory Visit (INDEPENDENT_AMBULATORY_CARE_PROVIDER_SITE_OTHER): Payer: BLUE CROSS/BLUE SHIELD | Admitting: Licensed Clinical Social Worker

## 2014-07-28 DIAGNOSIS — F322 Major depressive disorder, single episode, severe without psychotic features: Secondary | ICD-10-CM

## 2014-08-11 ENCOUNTER — Other Ambulatory Visit: Payer: Self-pay | Admitting: Endocrinology

## 2014-08-16 ENCOUNTER — Ambulatory Visit (INDEPENDENT_AMBULATORY_CARE_PROVIDER_SITE_OTHER): Payer: BLUE CROSS/BLUE SHIELD | Admitting: Licensed Clinical Social Worker

## 2014-08-16 DIAGNOSIS — F322 Major depressive disorder, single episode, severe without psychotic features: Secondary | ICD-10-CM | POA: Diagnosis not present

## 2014-09-08 ENCOUNTER — Ambulatory Visit (INDEPENDENT_AMBULATORY_CARE_PROVIDER_SITE_OTHER): Payer: BLUE CROSS/BLUE SHIELD | Admitting: Licensed Clinical Social Worker

## 2014-09-08 DIAGNOSIS — F322 Major depressive disorder, single episode, severe without psychotic features: Secondary | ICD-10-CM

## 2014-09-25 ENCOUNTER — Other Ambulatory Visit: Payer: Self-pay | Admitting: Internal Medicine

## 2014-09-27 ENCOUNTER — Ambulatory Visit (INDEPENDENT_AMBULATORY_CARE_PROVIDER_SITE_OTHER): Payer: BLUE CROSS/BLUE SHIELD | Admitting: Licensed Clinical Social Worker

## 2014-09-27 DIAGNOSIS — F322 Major depressive disorder, single episode, severe without psychotic features: Secondary | ICD-10-CM | POA: Diagnosis not present

## 2014-10-18 ENCOUNTER — Ambulatory Visit (INDEPENDENT_AMBULATORY_CARE_PROVIDER_SITE_OTHER): Payer: BLUE CROSS/BLUE SHIELD | Admitting: Licensed Clinical Social Worker

## 2014-10-18 DIAGNOSIS — F322 Major depressive disorder, single episode, severe without psychotic features: Secondary | ICD-10-CM

## 2014-11-08 ENCOUNTER — Ambulatory Visit: Payer: BLUE CROSS/BLUE SHIELD | Admitting: Licensed Clinical Social Worker

## 2014-11-15 ENCOUNTER — Other Ambulatory Visit: Payer: Self-pay | Admitting: Internal Medicine

## 2014-11-18 ENCOUNTER — Other Ambulatory Visit: Payer: Self-pay | Admitting: Internal Medicine

## 2014-11-18 ENCOUNTER — Other Ambulatory Visit: Payer: Self-pay | Admitting: Endocrinology

## 2014-12-05 ENCOUNTER — Telehealth: Payer: Self-pay | Admitting: Internal Medicine

## 2014-12-05 NOTE — Telephone Encounter (Signed)
[  12/05/2014 12:29 PM] Troy Becker:  he said he will not have ins at the end of this month Can I use a 30 min slot somewhere? [12/05/2014 12:29 PM] Janalee Dane:  yes, you can put 2-15 together if needed or use the 11:30 slot

## 2014-12-05 NOTE — Telephone Encounter (Signed)
Patient scheduled for 12/06/14 at 11:30 for physical

## 2014-12-08 ENCOUNTER — Other Ambulatory Visit: Payer: Self-pay

## 2014-12-13 ENCOUNTER — Encounter: Payer: BLUE CROSS/BLUE SHIELD | Admitting: Internal Medicine

## 2014-12-15 ENCOUNTER — Ambulatory Visit: Payer: Self-pay | Admitting: Internal Medicine

## 2014-12-26 ENCOUNTER — Other Ambulatory Visit: Payer: Self-pay | Admitting: Endocrinology

## 2015-02-09 ENCOUNTER — Other Ambulatory Visit: Payer: Self-pay | Admitting: Internal Medicine

## 2015-02-21 ENCOUNTER — Telehealth: Payer: Self-pay | Admitting: Internal Medicine

## 2015-02-21 NOTE — Telephone Encounter (Signed)
Pt has not been seen since 11/2013. Will not be able to refill or call to approve until Pt is seen by Dr. Larose Kells.

## 2015-02-21 NOTE — Telephone Encounter (Signed)
Called to inform patient of below. Left message for patient to call and schedule office visit with Dr. Larose Kells

## 2015-02-21 NOTE — Telephone Encounter (Signed)
Pt has appt 03/01/15 but needing refill and stating they require PA if he has not trialed other meds (below).

## 2015-02-21 NOTE — Telephone Encounter (Signed)
Caller name: Self  Can be reached: [phone]  Pharmacy:  [facility] [id number] - [place], [address] AT [facility] [address] [phone] (Phone) [phone] (Fax)         Reason for call: Pharmacy needs a new rx written for ezetimibe-simvastatin (VYTORIN) 10-40 MG per tablet [id number], states that he either need Atorastatin, Lovastaton or Preavastatin. Either of these generic can be used without prior approval. If patient can not use one of the generics, plse call [phone] to give approval for Vytorin. Plse call patient if one of the generics is called in

## 2015-02-21 NOTE — Telephone Encounter (Signed)
Still will not be able to even complete PA until seen.

## 2015-02-23 ENCOUNTER — Telehealth: Payer: Self-pay | Admitting: Endocrinology

## 2015-02-23 ENCOUNTER — Encounter: Payer: Self-pay | Admitting: Endocrinology

## 2015-02-23 ENCOUNTER — Ambulatory Visit (INDEPENDENT_AMBULATORY_CARE_PROVIDER_SITE_OTHER): Payer: 59 | Admitting: Endocrinology

## 2015-02-23 VITALS — BP 166/96 | HR 94 | Temp 98.2°F | Ht 71.0 in | Wt 243.0 lb

## 2015-02-23 DIAGNOSIS — E119 Type 2 diabetes mellitus without complications: Secondary | ICD-10-CM

## 2015-02-23 LAB — POCT GLYCOSYLATED HEMOGLOBIN (HGB A1C): HEMOGLOBIN A1C: 12.2

## 2015-02-23 MED ORDER — INSULIN LISPRO PROT & LISPRO (75-25 MIX) 100 UNIT/ML KWIKPEN: 60.0000 [IU] | PEN_INJECTOR | Freq: Every day | SUBCUTANEOUS | Status: DC

## 2015-02-23 MED ORDER — INSULIN ASPART PROT & ASPART (70-30 MIX) 100 UNIT/ML PEN
60.0000 [IU] | PEN_INJECTOR | Freq: Two times a day (BID) | SUBCUTANEOUS | Status: DC
Start: 2015-02-23 — End: 2015-02-23

## 2015-02-23 MED ORDER — INSULIN PEN NEEDLE 31G X 8 MM MISC: Status: DC

## 2015-02-23 NOTE — Telephone Encounter (Signed)
What size needle is needed for his insulin? Call pharmacy to let them know please # [phone]

## 2015-02-23 NOTE — Progress Notes (Signed)
Subjective:    Patient ID: Troy Becker, male    DOB: [birth year], 54 y.o.   MRN: [id number]  HPI Pt returns for f/u of diabetes mellitus: DM type: Insulin-requiring type 2. Dx'ed: 5038 Complications: none Therapy: insulin since 2010. DKA: never Severe hypoglycemia: never Pancreatitis: never Other: he has chosen qd insulin Interval history: He regained his insurance.  He says he never misses the insulin.  He has not recently checked cbg's.  He has gained weight.  Past Medical History  Diagnosis Date  . Hypertension   . Diabetes mellitus   . Hyperlipidemia   . Abnormal EKG 2006    w/ a neg stress test    Past Surgical History  Procedure Laterality Date  . Multiple tooth extractions      Social History   Social History  . Marital Status: Married    Spouse Name: N/A  . Number of Children: 2  . Years of Education: N/A   Occupational History  . Project Management (Hospital doctor) sedentary    .  [employer]   Social History Main Topics  . Smoking status: Never Smoker   . Smokeless tobacco: Never Used  . Alcohol Use: No  . Drug Use: No  . Sexual Activity: Not on file   Other Topics Concern  . Not on file   Social History Narrative             Current Outpatient Prescriptions on File Prior to Visit  Medication Sig Dispense Refill  . aspirin 81 MG tablet Take 81 mg by mouth daily.      . Blood Glucose Monitoring Suppl (ONE TOUCH ULTRA 2) W/DEVICE KIT 1 Device by Does not apply route once. 1 each 0  . ezetimibe-simvastatin (VYTORIN) 10-40 MG per tablet Take 1 tablet by mouth at bedtime. 90 tablet 0  . losartan-hydrochlorothiazide (HYZAAR) 100-12.5 MG tablet Take 1 tablet by mouth daily. 30 tablet 0  . ONETOUCH DELICA LANCETS MISC Use as directed to check blood sugar twice daily dx 250.02 100 each 5  . sildenafil (VIAGRA) 100 MG tablet Take 0.5-1 tablets (50-100 mg total) by mouth daily as needed for erectile dysfunction. (Patient not taking:  Reported on 02/23/2015) 3 tablet 3  . terbinafine (LAMISIL) 1 % cream Apply 1 application topically at bedtime. Reported on 02/23/2015     No current facility-administered medications on file prior to visit.    No Known Allergies  Family History  Problem Relation Age of Onset  . Coronary artery disease Father     CABG 69  . Hypertension Father   . Hypertension Mother   . Hypertension Sister   . Hypertension Brother   . Colon cancer Neg Hx   . Prostate cancer Neg Hx   . Diabetes Brother     F M    BP 166/96 mmHg  Pulse 94  Temp(Src) 98.2 F (36.8 C) (Oral)  Ht _0  (1.803 m)  Wt 243 lb (110.224 kg)  BMI 33.91 kg/m2  SpO2 95%  Review of Systems He denies hypoglycemia    Objective:   Physical Exam VITAL SIGNS:  See vs page GENERAL: no distress Pulses: dorsalis pedis intact bilat.   MSK: no deformity of the feet CV: no leg edema Skin:  1x2 cm shallow ulcer on the right great toe, dorsal aspect.  normal color and temp on the feet. Neuro: sensation is intact to touch on the feet Ext: There is bilateral onychomycosis of the toenails  Lab Results  Component Value Date   HGBA1C 12.2 02/23/2015       Assessment & Plan:  DM: ongoing poor glycemic control Noncompliance with cbg recording, worse: we'll have to titrate insulin based on a1c.  Foot ulcer, new.    Patient is advised the following: Patient Instructions  Please increase the insulin to 60 units with breakfast.  However, if you are going to be active, take just 40 units.  i have sent a prescription to your pharmacy Please let us know if your insurance prefers humalog 75/25, and we'll change the prescription.   Please keep the ulcer on your toe covered with antibiotic ointment and a large bandaid.  Also, carefully watch it, and call us if it gets bigger.  Please come back for a follow-up appointment in 1 month.  check your blood sugar twice a day.  vary the time of day when you check, between before the 3  meals, and at bedtime.  also check if you have symptoms of your blood sugar being too high or too low.  please keep a record of the readings and bring it to your next appointment here.  please call us sooner if your blood sugar goes below 70, or if you have a lot of readings over 200.

## 2015-02-23 NOTE — Telephone Encounter (Signed)
Pharmacy notifed to dispense 31 G x 80mm.

## 2015-02-23 NOTE — Patient Instructions (Addendum)
Please increase the insulin to 60 units with breakfast.  However, if you are going to be active, take just 40 units.  i have sent a prescription to your pharmacy Please let us know if your insurance prefers humalog 75/25, and we'll change the prescription.   Please keep the ulcer on your toe covered with antibiotic ointment and a large bandaid.  Also, carefully watch it, and call us if it gets bigger.  Please come back for a follow-up appointment in 1 month.  check your blood sugar twice a day.  vary the time of day when you check, between before the 3 meals, and at bedtime.  also check if you have symptoms of your blood sugar being too high or too low.  please keep a record of the readings and bring it to your next appointment here.  please call us sooner if your blood sugar goes below 70, or if you have a lot of readings over 200.

## 2015-02-28 ENCOUNTER — Telehealth: Payer: Self-pay | Admitting: Behavioral Health

## 2015-02-28 NOTE — Telephone Encounter (Signed)
Attempted to reach patient at time of Pre-Visit Call. The voice mailbox is full; unable to leave a message.

## 2015-03-01 ENCOUNTER — Encounter: Payer: 59 | Admitting: Internal Medicine

## 2015-03-03 ENCOUNTER — Encounter: Payer: Self-pay | Admitting: Internal Medicine

## 2015-03-03 ENCOUNTER — Ambulatory Visit (INDEPENDENT_AMBULATORY_CARE_PROVIDER_SITE_OTHER): Payer: 59 | Admitting: Internal Medicine

## 2015-03-03 VITALS — BP 154/98 | HR 86 | Temp 97.4°F | Ht 71.0 in | Wt 243.8 lb

## 2015-03-03 DIAGNOSIS — E119 Type 2 diabetes mellitus without complications: Secondary | ICD-10-CM

## 2015-03-03 DIAGNOSIS — Z0000 Encounter for general adult medical examination without abnormal findings: Secondary | ICD-10-CM | POA: Diagnosis not present

## 2015-03-03 DIAGNOSIS — Z125 Encounter for screening for malignant neoplasm of prostate: Secondary | ICD-10-CM

## 2015-03-03 DIAGNOSIS — Z23 Encounter for immunization: Secondary | ICD-10-CM

## 2015-03-03 DIAGNOSIS — E785 Hyperlipidemia, unspecified: Secondary | ICD-10-CM

## 2015-03-03 LAB — COMPREHENSIVE METABOLIC PANEL
ALBUMIN: 3.6 g/dL (ref 3.6–5.1)
ALK PHOS: 111 U/L (ref 40–115)
ALT: 13 U/L (ref 9–46)
AST: 11 U/L (ref 10–35)
BILIRUBIN TOTAL: 1.1 mg/dL (ref 0.2–1.2)
BUN: 18 mg/dL (ref 7–25)
CALCIUM: 9.4 mg/dL (ref 8.6–10.3)
CO2: 34 mmol/L — ABNORMAL HIGH (ref 20–31)
Chloride: 96 mmol/L — ABNORMAL LOW (ref 98–110)
Creat: 1.14 mg/dL (ref 0.70–1.33)
GLUCOSE: 296 mg/dL — AB (ref 65–99)
Potassium: 4.2 mmol/L (ref 3.5–5.3)
Sodium: 136 mmol/L (ref 135–146)
Total Protein: 6.8 g/dL (ref 6.1–8.1)

## 2015-03-03 LAB — CBC WITH DIFFERENTIAL/PLATELET
BASOS ABS: 0 10*3/uL (ref 0.0–0.1)
Basophils Relative: 1 % (ref 0–1)
Eosinophils Absolute: 0.1 10*3/uL (ref 0.0–0.7)
Eosinophils Relative: 3 % (ref 0–5)
HEMATOCRIT: 45.3 % (ref 39.0–52.0)
HEMOGLOBIN: 14.8 g/dL (ref 13.0–17.0)
LYMPHS PCT: 37 % (ref 12–46)
Lymphs Abs: 1.6 10*3/uL (ref 0.7–4.0)
MCH: 27.4 pg (ref 26.0–34.0)
MCHC: 32.7 g/dL (ref 30.0–36.0)
MCV: 83.7 fL (ref 78.0–100.0)
MONO ABS: 0.4 10*3/uL (ref 0.1–1.0)
MPV: 9.1 fL (ref 8.6–12.4)
Monocytes Relative: 9 % (ref 3–12)
NEUTROS ABS: 2.2 10*3/uL (ref 1.7–7.7)
Neutrophils Relative %: 50 % (ref 43–77)
Platelets: 273 10*3/uL (ref 150–400)
RBC: 5.41 MIL/uL (ref 4.22–5.81)
RDW: 13.3 % (ref 11.5–15.5)
WBC: 4.3 10*3/uL (ref 4.0–10.5)

## 2015-03-03 LAB — LIPID PANEL
Cholesterol: 265 mg/dL — ABNORMAL HIGH (ref 125–200)
HDL: 60 mg/dL (ref >=40)
LDL Cholesterol: 176 mg/dL — ABNORMAL HIGH (ref <130)
Total CHOL/HDL Ratio: 4.4 Ratio (ref <=5.0)
Triglycerides: 143 mg/dL (ref <150)
VLDL: 29 mg/dL (ref <30)

## 2015-03-03 MED ORDER — SIMVASTATIN 40 MG PO TABS: 40.0000 mg | ORAL_TABLET | Freq: Every day | ORAL | Status: DC

## 2015-03-03 NOTE — Progress Notes (Signed)
Subjective:    Patient ID: Troy Becker, male    DOB: September 28, 1960, 54 y.o.   MRN: [id number]  DOS:  03/03/2015 Type of visit - description : CPX, last visit a year ago Interval history: In addition to his CPX, will also manage other medical problems. Was doing very well until August 2016, then he lost his job and had to find another one, did not have time to exercise; BP previously excellent in the 120s increased,later in the year he lost his father >>> more stress. Wt increased from 228 ---> 240s per his scale   Review of Systems Constitutional: No fever. No chills. No unexplained wt changes. No unusual sweats  HEENT: No dental problems, no ear discharge, no facial swelling, no voice changes. No eye discharge, no eye  redness , no  intolerance to light   Respiratory: No wheezing , no  difficulty breathing. No cough , no mucus production  Cardiovascular: No CP, no leg swelling , no  Palpitations  GI: no nausea, no vomiting, no diarrhea , no  abdominal pain.  No blood in the stools. No dysphagia, no odynophagia    Endocrine: No polyphagia, no polyuria , no polydipsia  GU: No dysuria, gross hematuria, difficulty urinating. No urinary urgency, no frequency.  Musculoskeletal: No joint swellings or unusual aches or pains  Skin: No change in the color of the skin, palor , no  Rash  Allergic, immunologic: No environmental allergies , no  food allergies  Neurological: No dizziness no  syncope. No headaches. No diplopia, no slurred, no slurred speech, no motor deficits, no facial  Numbness  Hematological: No enlarged lymph nodes, no easy bruising , no unusual bleedings  Psychiatry: No suicidal ideas, no hallucinations, no beavior problems, no confusion.       Past Medical History  Diagnosis Date  . Hypertension   . Diabetes mellitus   . Hyperlipidemia   . Abnormal EKG 2006    w/ a neg stress test    Past Surgical History  Procedure Laterality Date  . Multiple tooth  extractions      Social History   Social History  . Marital Status: Married    Spouse Name: N/A  . Number of Children: 2  . Years of Education: N/A   Occupational History  . works for [employer] (used to work for [employer])    Social History Main Topics  . Smoking status: Never Smoker   . Smokeless tobacco: Never Used  . Alcohol Use: No  . Drug Use: No  . Sexual Activity: Not on file   Other Topics Concern  . Not on file   Social History Narrative   Household-- pt, wife, son           Family History  Problem Relation Age of Onset  . Coronary artery disease Father     CABG 23  . Hypertension Father   . Hypertension Mother   . Hypertension Sister   . Hypertension Brother   . Colon cancer Neg Hx   . Prostate cancer Neg Hx   . Diabetes Brother     F M      Medication List       This list is accurate as of: 03/03/15 11:59 PM.  Always use your most recent med list.               aspirin 81 MG tablet  Take 81 mg by mouth daily.     Insulin Lispro Prot &  Lispro (75-25) 100 UNIT/ML Kwikpen  Commonly known as:  HUMALOG MIX 75/25 KWIKPEN  Inject 60 Units into the skin daily with breakfast. And pen needles 1/day     Insulin Pen Needle 31G X 8 MM Misc  Commonly known as:  B-D ULTRAFINE III SHORT PEN  1 time per day     losartan-hydrochlorothiazide 100-12.5 MG tablet  Commonly known as:  HYZAAR  Take 1 tablet by mouth daily.     ONE TOUCH ULTRA 2 w/Device Kit  1 Device by Does not apply route once.     ONETOUCH DELICA LANCETS Misc  Use as directed to check blood sugar twice daily dx 250.02     sildenafil 100 MG tablet  Commonly known as:  VIAGRA  Take 0.5-1 tablets (50-100 mg total) by mouth daily as needed for erectile dysfunction.     simvastatin 40 MG tablet  Commonly known as:  ZOCOR  Take 1 tablet (40 mg total) by mouth at bedtime.     terbinafine 1 % cream  Commonly known as:  LAMISIL  Apply 1 application topically at bedtime. Reported on  03/03/2015           Objective:   Physical Exam BP 154/98 mmHg  Pulse 86  Temp(Src) 97.4 F (36.3 C) (Oral)  Ht '5\' 11"'  (1.803 m)  Wt 243 lb 12.8 oz (110.587 kg)  BMI 34.02 kg/m2  SpO2 98% General:   Well developed, well nourished . NAD.  Neck:  Full range of motion. Supple. No  thyromegaly , normal carotid pulse HEENT:  Normocephalic . Face symmetric, atraumatic Lungs:  CTA B Normal respiratory effort, no intercostal retractions, no accessory muscle use. Heart: RRR,  no murmur.  No pretibial edema bilaterally  Abdomen:  Not distended, soft, non-tender. No rebound or rigidity. No mass,organomegaly Rectal:  External abnormalities: none. Normal sphincter tone. No rectal masses or tenderness.  No stools  Prostate: Exam limited but essentially normal: Prostate gland firm and smooth, no enlargement, nodularity, tenderness, mass, asymmetry or induration.  Skin: Exposed areas without rash. Not pale. Not jaundice Neurologic:  alert & oriented X3.  Speech normal, gait appropriate for age and unassisted Strength symmetric and appropriate for age.  Psych: Cognition and judgment appear intact.  Cooperative with normal attention span and concentration.  Behavior appropriate. No anxious or depressed appearing.     Assessment & Plan:   Assessment DM, [name] HTN Hyperlipidemia Abnormal EKG, negative stress test 2006 FH CAD-- F CABG age 30   PLAN: DM-- per endo HTN: see HPI, was ok until had no time to exercise or eat healthy. No change for now, life style discussed, recheck in 3 months High chol -- of vytorin, needs a generic for the next few months until a better insurance starts. Plan-- FLP, rx simvastatin Stress, anxiety-- counseled, medication? Declined  RTC 3 months

## 2015-03-03 NOTE — Patient Instructions (Signed)
BEFORE YOU LEAVE THE OFFICE:  GO TO THE LAB  Get the blood work    GO TO THE FRONT DESK  Schedule a routine office visit or check up to be done in  3 months  Please be fasting   Front desk:  [place] OFFICE:  Exercise Low salt diet!  Check the  blood pressure 2 or 3 times a week Be sure your blood pressure is between 110/65 and  145/85. If it is consistently higher or lower, let me know

## 2015-03-03 NOTE — Progress Notes (Signed)
Pre visit review using our clinic review tool, if applicable. No additional management support is needed unless otherwise documented below in the visit note. 

## 2015-03-03 NOTE — Assessment & Plan Note (Signed)
Td 2010 pnm 23 today, declined flu shot had a cscope 10-2011, 2 polyps, next per GI DRE neg, check a PSA  Diet and  exercise -- discussed, was doing well until 10-2014, see HPI

## 2015-03-04 LAB — PSA: PSA: 1.09 ng/mL (ref <=4.00)

## 2015-03-27 ENCOUNTER — Ambulatory Visit: Payer: Self-pay | Admitting: Endocrinology

## 2015-04-03 ENCOUNTER — Telehealth: Payer: Self-pay

## 2015-04-03 MED ORDER — INSULIN ASPART PROT & ASPART (70-30 MIX) 100 UNIT/ML PEN: PEN_INJECTOR | SUBCUTANEOUS | Status: DC

## 2015-04-03 NOTE — Telephone Encounter (Signed)
Received a PA for Humalog 75/25. Can we change to novolog 70/30? Thanks!

## 2015-04-03 NOTE — Telephone Encounter (Signed)
ok 

## 2015-04-03 NOTE — Telephone Encounter (Signed)
Rx sent and pt notified of med change.

## 2015-04-19 ENCOUNTER — Ambulatory Visit: Payer: Self-pay | Admitting: Endocrinology

## 2015-04-28 ENCOUNTER — Ambulatory Visit: Payer: Self-pay | Admitting: Endocrinology

## 2015-05-06 ENCOUNTER — Other Ambulatory Visit: Payer: Self-pay | Admitting: Internal Medicine

## 2015-05-27 ENCOUNTER — Other Ambulatory Visit: Payer: Self-pay | Admitting: Endocrinology

## 2015-06-01 ENCOUNTER — Telehealth: Payer: Self-pay | Admitting: Internal Medicine

## 2015-06-01 ENCOUNTER — Ambulatory Visit: Payer: Self-pay | Admitting: Internal Medicine

## 2015-06-05 NOTE — Telephone Encounter (Signed)
Multiple cancellations in appt desk. Charge.

## 2015-06-05 NOTE — Telephone Encounter (Signed)
Pt was no show 06/01/15 8:45am for follow up appt, pt has not rescheduled, charge or no charge?

## 2015-06-06 ENCOUNTER — Encounter: Payer: Self-pay | Admitting: Internal Medicine

## 2015-06-06 NOTE — Telephone Encounter (Signed)
Marked to charge and mailing no show letter °

## 2015-06-22 ENCOUNTER — Other Ambulatory Visit: Payer: Self-pay | Admitting: Internal Medicine

## 2015-06-27 ENCOUNTER — Ambulatory Visit: Payer: Self-pay | Admitting: Internal Medicine

## 2015-07-21 ENCOUNTER — Ambulatory Visit: Payer: Self-pay | Admitting: Internal Medicine

## 2015-08-04 ENCOUNTER — Other Ambulatory Visit: Payer: Self-pay

## 2015-08-04 MED ORDER — LOSARTAN POTASSIUM-HCTZ 100-12.5 MG PO TABS: 1.0000 | ORAL_TABLET | Freq: Every day | ORAL | Status: DC

## 2015-08-09 ENCOUNTER — Ambulatory Visit: Payer: Self-pay | Admitting: Internal Medicine

## 2015-09-13 ENCOUNTER — Other Ambulatory Visit: Payer: Self-pay | Admitting: Internal Medicine

## 2015-10-10 ENCOUNTER — Other Ambulatory Visit: Payer: Self-pay | Admitting: Internal Medicine

## 2015-10-18 ENCOUNTER — Telehealth: Payer: Self-pay | Admitting: Internal Medicine

## 2015-10-18 MED ORDER — LOSARTAN POTASSIUM-HCTZ 100-12.5 MG PO TABS: 1.0000 | ORAL_TABLET | Freq: Every day | ORAL | 0 refills | Status: DC

## 2015-10-18 NOTE — Telephone Encounter (Addendum)
Relation to [id number] Call back number:[phone] Pharmacy: [facility] [place], [address] AT [place] [phone] (Phone) [phone] (Fax)    Reason for call:  Patient requesting 2 blood pressure pills to hold him over until 10/20/15 appointment. Patient states he's completely out.

## 2015-10-18 NOTE — Telephone Encounter (Signed)
Losartan-HCTZ #15 and 0RF sent to pharmacy.

## 2015-10-20 ENCOUNTER — Ambulatory Visit: Payer: Self-pay | Admitting: Internal Medicine

## 2015-10-20 DIAGNOSIS — Z0289 Encounter for other administrative examinations: Secondary | ICD-10-CM

## 2015-10-21 ENCOUNTER — Other Ambulatory Visit: Payer: Self-pay | Admitting: Endocrinology

## 2015-10-23 ENCOUNTER — Ambulatory Visit (INDEPENDENT_AMBULATORY_CARE_PROVIDER_SITE_OTHER): Payer: Self-pay | Admitting: Internal Medicine

## 2015-10-23 ENCOUNTER — Encounter: Payer: Self-pay | Admitting: Internal Medicine

## 2015-10-23 VITALS — BP 138/88 | HR 67 | Temp 98.1°F | Resp 14 | Ht 71.0 in | Wt 240.4 lb

## 2015-10-23 DIAGNOSIS — E1169 Type 2 diabetes mellitus with other specified complication: Secondary | ICD-10-CM

## 2015-10-23 DIAGNOSIS — E785 Hyperlipidemia, unspecified: Secondary | ICD-10-CM | POA: Diagnosis not present

## 2015-10-23 DIAGNOSIS — I10 Essential (primary) hypertension: Secondary | ICD-10-CM

## 2015-10-23 DIAGNOSIS — E1340 Other specified diabetes mellitus with diabetic neuropathy, unspecified: Secondary | ICD-10-CM

## 2015-10-23 DIAGNOSIS — Z09 Encounter for follow-up examination after completed treatment for conditions other than malignant neoplasm: Secondary | ICD-10-CM | POA: Insufficient documentation

## 2015-10-23 LAB — BASIC METABOLIC PANEL
BUN: 16 mg/dL (ref 6–23)
CHLORIDE: 98 meq/L (ref 96–112)
CO2: 33 mEq/L — ABNORMAL HIGH (ref 19–32)
Calcium: 9.2 mg/dL (ref 8.4–10.5)
Creatinine, Ser: 1.35 mg/dL (ref 0.40–1.50)
GFR: 70.04 mL/min (ref >60.00)
Glucose, Bld: 240 mg/dL — ABNORMAL HIGH (ref 70–99)
POTASSIUM: 3.7 meq/L (ref 3.5–5.1)
SODIUM: 138 meq/L (ref 135–145)

## 2015-10-23 LAB — LIPID PANEL
CHOLESTEROL: 156 mg/dL (ref 0–200)
HDL: 53.9 mg/dL (ref >39.00)
LDL Cholesterol: 80 mg/dL (ref 0–99)
NonHDL: 101.87
TRIGLYCERIDES: 110 mg/dL (ref 0.0–149.0)
Total CHOL/HDL Ratio: 3
VLDL: 22 mg/dL (ref 0.0–40.0)

## 2015-10-23 LAB — AST: AST: 11 U/L (ref 0–37)

## 2015-10-23 LAB — ALT: ALT: 10 U/L (ref 0–53)

## 2015-10-23 MED ORDER — SIMVASTATIN 40 MG PO TABS: 40.0000 mg | ORAL_TABLET | Freq: Every day | ORAL | 5 refills | Status: DC

## 2015-10-23 MED ORDER — LOSARTAN POTASSIUM-HCTZ 100-12.5 MG PO TABS: 1.0000 | ORAL_TABLET | Freq: Every day | ORAL | 5 refills | Status: DC

## 2015-10-23 NOTE — Patient Instructions (Signed)
GO TO THE LAB : Get the blood work     GO TO THE FRONT DESK Schedule your next appointment for a  physical exam for December 2017    Check the  blood pressure 2 or 3 times a month week  Be sure your blood pressure is between 110/65 and  145/85. If it is consistently higher or lower, let me know   Diabetes and Foot Care Diabetes may cause you to have problems because of poor blood supply (circulation) to your feet and legs. This may cause the skin on your feet to become thinner, break easier, and heal more slowly. Your skin may become dry, and the skin may peel and crack. You may also have nerve damage in your legs and feet causing decreased feeling in them. You may not notice minor injuries to your feet that could lead to infections or more serious problems. Taking care of your feet is one of the most important things you can do for yourself.  HOME CARE INSTRUCTIONS  Wear shoes at all times, even in the house. Do not go barefoot. Bare feet are easily injured.  Check your feet daily for blisters, cuts, and redness. If you cannot see the bottom of your feet, use a mirror or ask someone for help.  Wash your feet with warm water (do not use hot water) and mild soap. Then pat your feet and the areas between your toes until they are completely dry. Do not soak your feet as this can dry your skin.  Apply a moisturizing lotion or petroleum jelly (that does not contain alcohol and is unscented) to the skin on your feet and to dry, brittle toenails. Do not apply lotion between your toes.  Trim your toenails straight across. Do not dig under them or around the cuticle. File the edges of your nails with an emery board or nail file.  Do not cut corns or calluses or try to remove them with medicine.  Wear clean socks or stockings every day. Make sure they are not too tight. Do not wear knee-high stockings since they may decrease blood flow to your legs.  Wear shoes that fit properly and have enough  cushioning. To break in new shoes, wear them for just a few hours a day. This prevents you from injuring your feet. Always look in your shoes before you put them on to be sure there are no objects inside.  Do not cross your legs. This may decrease the blood flow to your feet.  If you find a minor scrape, cut, or break in the skin on your feet, keep it and the skin around it clean and dry. These areas may be cleansed with mild soap and water. Do not cleanse the area with peroxide, alcohol, or iodine.  When you remove an adhesive bandage, be sure not to damage the skin around it.  If you have a wound, look at it several times a day to make sure it is healing.  Do not use heating pads or hot water bottles. They may burn your skin. If you have lost feeling in your feet or legs, you may not know it is happening until it is too late.  Make sure your health care provider performs a complete foot exam at least annually or more often if you have foot problems. Report any cuts, sores, or bruises to your health care provider immediately. SEEK MEDICAL CARE IF:   You have an injury that is not healing.  You have cuts or breaks in the skin.  You have an ingrown nail.  You notice redness on your legs or feet.  You feel burning or tingling in your legs or feet.  You have pain or cramps in your legs and feet.  Your legs or feet are numb.  Your feet always feel cold. SEEK IMMEDIATE MEDICAL CARE IF:   There is increasing redness, swelling, or pain in or around a wound.  There is a red line that goes up your leg.  Pus is coming from a wound.  You develop a fever or as directed by your health care provider.  You notice a bad smell coming from an ulcer or wound.   This information is not intended to replace advice given to you by your health care provider. Make sure you discuss any questions you have with your health care provider.   Document Released: 02/16/2000 Document Revised: 10/21/2012  Document Reviewed: 07/28/2012 Elsevier Interactive Patient Education [employer].

## 2015-10-23 NOTE — Progress Notes (Signed)
Subjective:    Patient ID: Troy Becker, male    DOB: 04-22-1960, 55 y.o.   MRN: [id number]  DOS:  10/23/2015 Type of visit - description : Routine checkup, last visit 02-2015 Interval history: HTN: Out of BP medications for a while, no ambulatory BPs, restarted BP meds 3 days ago. High cholesterol: Reports good compliance with simvastatin. Due for labs Complains of edema bilaterally, started a part-time job doing security, walks and stands a lot   Review of Systems Denies chest pain or difficulty breathing No nausea, vomiting, diarrhea.   Past Medical History:  Diagnosis Date  . Abnormal EKG 2006   w/ a neg stress test  . Diabetes mellitus   . Hyperlipidemia   . Hypertension     Past Surgical History:  Procedure Laterality Date  . MULTIPLE TOOTH EXTRACTIONS      Social History   Social History  . Marital status: Married    Spouse name: N/A  . Number of children: 2  . Years of education: N/A   Occupational History  . out of work since 06-2015, now has a part time    Social History Main Topics  . Smoking status: Never Smoker  . Smokeless tobacco: Never Used  . Alcohol use No  . Drug use: No  . Sexual activity: Not on file   Other Topics Concern  . Not on file   Social History Narrative   Household-- pt, wife, son              Medication List       Accurate as of 10/23/15  5:13 PM. Always use your most recent med list.          aspirin 81 MG tablet Take 81 mg by mouth daily.   B-D ULTRAFINE III SHORT PEN 31G X 8 MM Misc Generic drug:  Insulin Pen Needle USE WITH PEN INJECTIONS   insulin aspart protamine - aspart (70-30) 100 UNIT/ML FlexPen Commonly known as:  NOVOLOG MIX 70/30 FLEXPEN Inject 60 units daily   losartan-hydrochlorothiazide 100-12.5 MG tablet Commonly known as:  HYZAAR Take 1 tablet by mouth daily.   ONE TOUCH ULTRA 2 w/Device Kit 1 Device by Does not apply route once.   ONETOUCH DELICA LANCETS Misc Use as directed to  check blood sugar twice daily dx 250.02   sildenafil 100 MG tablet Commonly known as:  VIAGRA Take 0.5-1 tablets (50-100 mg total) by mouth daily as needed for erectile dysfunction.   simvastatin 40 MG tablet Commonly known as:  ZOCOR Take 1 tablet (40 mg total) by mouth at bedtime.   terbinafine 1 % cream Commonly known as:  LAMISIL Apply 1 application topically at bedtime. Reported on 03/03/2015          Objective:   Physical Exam BP 138/88 (BP Location: Left Arm, Patient Position: Sitting, Cuff Size: Normal)   Pulse 67   Temp 98.1 F (36.7 C) (Oral)   Resp 14   Ht '5\' 11"'  (1.803 m)   Wt 240 lb 6 oz (109 kg)   SpO2 97%   BMI 33.53 kg/m  General:   Well developed, well nourished . NAD.  HEENT:  Normocephalic . Face symmetric, atraumatic Lungs:  CTA B Normal respiratory effort, no intercostal retractions, no accessory muscle use. Heart: RRR,  no murmur.  DIABETIC FEET EXAM: Trace  lower extremity edema Normal pedal pulses bilaterally Skin normal: Dry, few callouses, has a large blister at the right great toe, no redness,  no discharge. Nails dystrophic throughout and very large. Pinprick examination of the feet: Mild patchy decrease pinprick sensation on the right distally Skin: Not pale. Not jaundice Neurologic:  alert & oriented X3.  Speech normal, gait appropriate for age and unassisted Psych--  Cognition and judgment appear intact.  Cooperative with normal attention span and concentration.  Behavior appropriate. No anxious or depressed appearing.      Assessment & Plan:   Assessment DM, [name]  Mild neuropathy per foot exam 10-2015 HTN Hyperlipidemia Abnormal EKG, negative stress test 2006 FH CAD-- F CABG age 1   PLAN: DM: Per [name], last visit 02/2015. Refer to [name]. Risk of uncontrolled DM again discuss, states he knows hia A1C ideal is 6.5. Also knows the need to have an eye exam. Neuropathy: Feet care discuss, refer to  podiatry. HTN: BP today is elevated at 142/90, repeated BP was 138/88., was out of medicine up to 3 days ago, check a BMP, recommend BP checks, if not at goal in the next few days will let me know. Cholesterol: was switch from Vytorin to simvastatin, check a FLP, AST, ALT Loss his job,  working part-time, fortunately states cost of medications has not been a major issue. RTC December 2017, CPX

## 2015-10-23 NOTE — Progress Notes (Signed)
Pre visit review using our clinic review tool, if applicable. No additional management support is needed unless otherwise documented below in the visit note. 

## 2015-10-23 NOTE — Assessment & Plan Note (Signed)
DM: Per [name], last visit 02/2015. Refer to [name]. Risk of uncontrolled DM again discuss, states he knows hia A1C ideal is 6.5. Also knows the need to have an eye exam. Neuropathy: Feet care discuss, refer to podiatry. HTN: BP today is elevated at 142/90, repeated BP was 138/88., was out of medicine up to 3 days ago, check a BMP, recommend BP checks, if not at goal in the next few days will let me know. Cholesterol: was switch from Vytorin to simvastatin, check a FLP, AST, ALT Loss his job,  working part-time, fortunately states cost of medications has not been a major issue. RTC December 2017, CPX

## 2015-10-24 ENCOUNTER — Encounter: Payer: Self-pay | Admitting: Internal Medicine

## 2015-10-30 ENCOUNTER — Ambulatory Visit (INDEPENDENT_AMBULATORY_CARE_PROVIDER_SITE_OTHER): Payer: BLUE CROSS/BLUE SHIELD | Admitting: Podiatry

## 2015-10-30 ENCOUNTER — Encounter: Payer: Self-pay | Admitting: Podiatry

## 2015-10-30 ENCOUNTER — Ambulatory Visit (INDEPENDENT_AMBULATORY_CARE_PROVIDER_SITE_OTHER): Payer: BLUE CROSS/BLUE SHIELD

## 2015-10-30 VITALS — BP 193/105 | HR 85 | Resp 16

## 2015-10-30 DIAGNOSIS — Z794 Long term (current) use of insulin: Secondary | ICD-10-CM | POA: Diagnosis not present

## 2015-10-30 DIAGNOSIS — L089 Local infection of the skin and subcutaneous tissue, unspecified: Secondary | ICD-10-CM | POA: Diagnosis not present

## 2015-10-30 DIAGNOSIS — S90421A Blister (nonthermal), right great toe, initial encounter: Secondary | ICD-10-CM | POA: Diagnosis not present

## 2015-10-30 DIAGNOSIS — L89891 Pressure ulcer of other site, stage 1: Secondary | ICD-10-CM | POA: Diagnosis not present

## 2015-10-30 DIAGNOSIS — R52 Pain, unspecified: Secondary | ICD-10-CM

## 2015-10-30 DIAGNOSIS — E119 Type 2 diabetes mellitus without complications: Secondary | ICD-10-CM | POA: Diagnosis not present

## 2015-10-30 DIAGNOSIS — L02611 Cutaneous abscess of right foot: Secondary | ICD-10-CM

## 2015-10-30 MED ORDER — DOXYCYCLINE HYCLATE 100 MG PO TABS: 100.0000 mg | ORAL_TABLET | Freq: Two times a day (BID) | ORAL | 0 refills | Status: DC

## 2015-10-30 NOTE — Progress Notes (Signed)
Subjective:    Patient ID: Troy Becker, male    DOB: 09/25/1960, 55 y.o.   MRN: [id number]  Toe Pain    This patient presents the office saying he was referred to this office for treatment of his long thick painful nails by Dr. Alfredo Batty.  Examination of his foot prior to the nail treatment revealed there were 2 separate skin lesions noted on his right  Big toe. There was a skin lesion on the top of his toe behind the nail area. He says that he previously had an injury which has led to a contracted big toe joint, right foot. He also says that he developed a blister on the tip of his right great toe last Friday. He says this blister burst on Friday He says he basically has paid no attention to this toe despite the fact that he says it became uncomfortable over the weekend.  He relates that this developed due to the shoes that he has been wearing at his job. He says he is wearing  hard shoes at work  which caused the blister on the tip of his toe and a healing skin lesion on the top of his right big toe.  He presents to the office today expecting nail trimming but I concentrated on his right foot.   Review of Systems  All other systems reviewed and are negative.      Objective:   Physical Exam GENERAL APPEARANCE: Alert, conversant. Appropriately groomed. No acute distress.  VASCULAR: Pedal pulses are  palpable at  [facility] and PT bilateral.  Capillary refill time is immediate to all digits,  Normal temperature gradient left foot.  Increased temperature right foot extending above his right ankle.   NEUROLOGIC: To be reviewed next visit. MUSCULOSKELETAL: acceptable muscle strength, tone and stability bilateral.  Intrinsic muscluature intact bilateral.  Contracture IPJ  Right hallux.   DERMATOLOGIC: skin color, texture, and turgor are within normal limits except right hallux. The dorsal aspect of IPJ reveals healing skin noted with no drainage or infection noted.  Examination of the distal aspect  right hallux reveals folded  redundant skin at the medial aspect nail plate .  There is a pressure ulcer noted at the distal aspect right hallux with an approximate 8 mm. X 8 mm.  ulcer .  Examination of the folded skin reveals healthy skin .  He has swelling extending from his toe extending over his foot and ankle and half-way up his calf.  Every time his right foot is touched he responds by pulling back.   NAILS  Thick disfigured discolored nails B/L  Diagnosis  Diabetic ulcer right hallux.   Infected ulcer right hallux.  Cellulitis right foot'  Treatment  IE  Xray right foot was taken.There is contracture and fixed deformity IPJ.  The distal phalanx appears osteopenic which may be due to disuse.  The dorsal skin lesion appears to be healing. Debridement of the necrotic tissue resulting from the burst  blister was removed.  The ulcer on the distal aspect of the toe was debrided  and cleaned.  The toe was then bandaged with Neosporin and dry sterile dressing and [place].  He was given a wooden shoe in which to ambulate. He was given home instructions for soaks of his right hallux. He was told to rest at home and not work until he was evaluated by Dr. Cannon Kettle in 2 days.  I am concerned about the fact that his great toe  has become infected and leading to a cellulitis that is extending up his leg.  This patient was prescribed doxycycline to take one twice a day.  After explaining my concern for the infection of his right foot. He asked about trimming his nails.  I told him that can be addressed at a later visit.  If this right foot infection  worsens or becomes very painful, the patient was told to contact this office or go to the Emergency Department at the hospital.  Gardiner Barefoot DPM          Assessment & Plan:

## 2015-11-01 ENCOUNTER — Ambulatory Visit (INDEPENDENT_AMBULATORY_CARE_PROVIDER_SITE_OTHER): Payer: BLUE CROSS/BLUE SHIELD | Admitting: Sports Medicine

## 2015-11-01 ENCOUNTER — Encounter: Payer: Self-pay | Admitting: Sports Medicine

## 2015-11-01 DIAGNOSIS — M79674 Pain in right toe(s): Secondary | ICD-10-CM

## 2015-11-01 DIAGNOSIS — L89891 Pressure ulcer of other site, stage 1: Secondary | ICD-10-CM | POA: Diagnosis not present

## 2015-11-01 DIAGNOSIS — E119 Type 2 diabetes mellitus without complications: Secondary | ICD-10-CM | POA: Diagnosis not present

## 2015-11-01 DIAGNOSIS — Z794 Long term (current) use of insulin: Secondary | ICD-10-CM

## 2015-11-01 DIAGNOSIS — M79675 Pain in left toe(s): Secondary | ICD-10-CM | POA: Diagnosis not present

## 2015-11-01 DIAGNOSIS — R609 Edema, unspecified: Secondary | ICD-10-CM | POA: Diagnosis not present

## 2015-11-01 DIAGNOSIS — L97512 Non-pressure chronic ulcer of other part of right foot with fat layer exposed: Secondary | ICD-10-CM

## 2015-11-01 DIAGNOSIS — B351 Tinea unguium: Secondary | ICD-10-CM | POA: Diagnosis not present

## 2015-11-01 NOTE — Progress Notes (Signed)
Subjective: Troy Becker is a 55 y.o. male patient seen in office for evaluation of ulceration of the right hallux Patient has a history of diabetes and a blood glucose level  today not recorded, but states that his sugars have not been good.   Patient is changing the dressing using Neosporin sometimes at home. Denies nausea/fever/vomiting/chills/night sweats/shortness of breath/pain. Patient has no other pedal complaints at this time.  Patient reports that ulcer started about 3 weeks ago when he tried to what a stiff pair of shoes. Reports a history of old fracture to toe that left toe and the pin position and every since always had problems with irritation and ulceration. States that typically it heals up in 2 weeks. However, this time has become more persistent. Patient was seen by Dr. Prudence Davidson on Monday, started on antibiotics with improvement in swelling and presentation of the toe and wound. Patient also states that he desires for his nails to be trimmed cane for that 2 days ago, however, state that Dr. Prudence Davidson was more concerned about the ulcer and did not trim his nails.  Patient Active Problem List   Diagnosis Date Noted  . PCP NOTES >>>>>>>>>>>>>>>>>> 10/23/2015  . Onychomycosis ? 06/03/2013  . Personal history of adenomatous colonic polyps 10/10/2011  . Annual physical exam 08/07/2011  . Encounter for long-term (current) use of other medications 08/05/2011  . Anxiety 07/21/2009  . ERECTILE DYSFUNCTION 11/05/2007  . Diabetes ([place]) 10/23/2006  . HYPERLIPIDEMIA 10/23/2006  . ELECTROCARDIOGRAM, ABNORMAL 10/23/2006  . HYPERTENSION 07/23/2006   Current Outpatient Prescriptions on File Prior to Visit  Medication Sig Dispense Refill  . aspirin 81 MG tablet Take 81 mg by mouth daily.      . B-D ULTRAFINE III SHORT PEN 31G X 8 MM MISC USE WITH PEN INJECTIONS 100 each 0  . Blood Glucose Monitoring Suppl (ONE TOUCH ULTRA 2) W/DEVICE KIT 1 Device by Does not apply route once. 1 each 0  .  doxycycline (VIBRA-TABS) 100 MG tablet Take 1 tablet (100 mg total) by mouth 2 (two) times daily. 20 tablet 0  . insulin aspart protamine - aspart (NOVOLOG MIX 70/30 FLEXPEN) (70-30) 100 UNIT/ML FlexPen Inject 60 units daily 30 mL 2  . losartan-hydrochlorothiazide (HYZAAR) 100-12.5 MG tablet Take 1 tablet by mouth daily. 30 tablet 5  . ONETOUCH DELICA LANCETS MISC Use as directed to check blood sugar twice daily dx 250.02 100 each 5  . sildenafil (VIAGRA) 100 MG tablet Take 0.5-1 tablets (50-100 mg total) by mouth daily as needed for erectile dysfunction. 3 tablet 3  . simvastatin (ZOCOR) 40 MG tablet Take 1 tablet (40 mg total) by mouth at bedtime. 30 tablet 5  . terbinafine (LAMISIL) 1 % cream Apply 1 application topically at bedtime. Reported on 03/03/2015     No current facility-administered medications on file prior to visit.    No Known Allergies  Recent Results (from the past 2160 hour(s))  Basic metabolic panel     Status: Abnormal   Collection Time: 10/23/15 11:10 AM  Result Value Ref Range   Sodium 138 135 - 145 mEq/L   Potassium 3.7 3.5 - 5.1 mEq/L   Chloride 98 96 - 112 mEq/L   CO2 33 (H) 19 - 32 mEq/L   Glucose, Bld 240 (H) 70 - 99 mg/dL   BUN 16 6 - 23 mg/dL   Creatinine, Ser 1.35 0.40 - 1.50 mg/dL   Calcium 9.2 8.4 - 10.5 mg/dL   GFR 70.04 >60.00 mL/min  AST     Status: None   Collection Time: 10/23/15 11:10 AM  Result Value Ref Range   AST 11 0 - 37 U/L  ALT     Status: None   Collection Time: 10/23/15 11:10 AM  Result Value Ref Range   ALT 10 0 - 53 U/L  Lipid panel     Status: None   Collection Time: 10/23/15 11:10 AM  Result Value Ref Range   Cholesterol 156 0 - 200 mg/dL    Comment: ATP III Classification       Desirable:  < 200 mg/dL               Borderline High:  200 - 239 mg/dL          High:  > = 240 mg/dL   Triglycerides 110.0 0.0 - 149.0 mg/dL    Comment: Normal:  <150 mg/dLBorderline High:  150 - 199 mg/dL   HDL 53.90 >39.00 mg/dL   VLDL 22.0 0.0 -  40.0 mg/dL   LDL Cholesterol 80 0 - 99 mg/dL   Total CHOL/HDL Ratio 3     Comment:                Men          Women1/2 Average Risk     3.4          3.3Average Risk          5.0          4.42X Average Risk          9.6          7.13X Average Risk          15.0          11.0                       NonHDL 101.87     Comment: NOTE:  Non-HDL goal should be 30 mg/dL higher than patient's LDL goal (i.e. LDL goal of < 70 mg/dL, would have non-HDL goal of < 100 mg/dL)    Objective: There were no vitals filed for this visit.  General: Patient is awake, alert, oriented x 3 and in no acute distress.  Dermatology: Skin is warm and dry bilateral with am full thickness ulceration present  Right hallux dorsal IPJ and distal tuft. Ulcerations measures 0.2 x 0.4 x 0.3 cm at right hallux dorsal IPJ and 1.5 x 1 x 0.4 cm at right hallux distal tuft, There is a  Keratotic border with evidence of Deroofed blistered skin and a fibro-granular base. The ulceration does not probe to bone. There is no malodor, no active drainage, no erythema, + edema. No other acute signs of infection. Nails 10 are elongated, thickened, dystrophic resembling onychomycosis.   Vascular: Dorsalis Pedis pulse = 2/4 Bilateral,  Posterior Tibial pulse = 1/4 Bilateral,  Capillary Fill Time < 5 seconds  Neurologic: Protective sensation intact using  the 5.07/10g Semmes Weinstein Monofilament.  Musculosketal:No Pain with palpation to ulcerated areas. No pain with compression to calves bilateral. 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  Assessment and Plan:  Problem List Items Addressed This Visit    None    Visit Diagnoses    Toe ulcer, right, with fat layer exposed ([place])    -  Primary   Type 2 diabetes mellitus without complication,  with long-term current use of insulin ([facility])       Dermatophytosis of nail        Toe pain, bilateral       Swelling         -Examined patient and discussed the progression of the wound and treatment alternatives. -Previous Xrays reviewed - Excisionally dedbrided ulceration to healthy bleeding borders using a sterile15  blade. -Applied antibiotic cream and dry sterile dressing and instructed patient to continue with daily dressings at home consisting of the same. -To right foot and lower leg applied, Soft [place] boot to assist with edema control. Advised patient to remove after 5 days.  -Recommend patient to continue with postoperative shoe -Recommend patient to continue with doxycycline until completed -Nails 10. Mechanically debrided using sterile nail nipper without incident - Advised patient to go to the ER or return to office if the wound worsens or if constitutional symptoms are present. -Patient to return to office in 1 week for follow up care and evaluation or sooner if problems arise. Discussed with patient, Once ulceration heals will benefit from IPJ fusion of right hallux.  Landis Martins, DPM

## 2015-11-07 ENCOUNTER — Ambulatory Visit: Payer: BLUE CROSS/BLUE SHIELD | Admitting: Podiatry

## 2015-11-08 ENCOUNTER — Encounter: Payer: Self-pay | Admitting: Sports Medicine

## 2015-11-08 ENCOUNTER — Ambulatory Visit (INDEPENDENT_AMBULATORY_CARE_PROVIDER_SITE_OTHER): Payer: BLUE CROSS/BLUE SHIELD | Admitting: Sports Medicine

## 2015-11-08 DIAGNOSIS — L89891 Pressure ulcer of other site, stage 1: Secondary | ICD-10-CM | POA: Diagnosis not present

## 2015-11-08 DIAGNOSIS — M1990 Unspecified osteoarthritis, unspecified site: Secondary | ICD-10-CM

## 2015-11-08 DIAGNOSIS — E119 Type 2 diabetes mellitus without complications: Secondary | ICD-10-CM

## 2015-11-08 DIAGNOSIS — Z794 Long term (current) use of insulin: Secondary | ICD-10-CM

## 2015-11-08 DIAGNOSIS — M19079 Primary osteoarthritis, unspecified ankle and foot: Secondary | ICD-10-CM

## 2015-11-08 DIAGNOSIS — M79674 Pain in right toe(s): Secondary | ICD-10-CM

## 2015-11-08 DIAGNOSIS — L97512 Non-pressure chronic ulcer of other part of right foot with fat layer exposed: Secondary | ICD-10-CM

## 2015-11-08 NOTE — Progress Notes (Signed)
Subjective: Troy Becker is a 55 y.o. male patient seen in office for follow up evaluation of ulceration of the right hallux. Patient has a history of diabetes and a blood glucose level  today not recorded.  Patient is changing the dressing using Neosporin every other day. Denies nausea/fever/vomiting/chills/night sweats/shortness of breath/pain. Patient has 1 more day of antibiotics left and has no other pedal complaints at this time.  Patient Active Problem List   Diagnosis Date Noted  . PCP NOTES >>>>>>>>>>>>>>>>>> 10/23/2015  . Onychomycosis ? 06/03/2013  . Personal history of adenomatous colonic polyps 10/10/2011  . Annual physical exam 08/07/2011  . Encounter for long-term (current) use of other medications 08/05/2011  . Anxiety 07/21/2009  . ERECTILE DYSFUNCTION 11/05/2007  . Diabetes ([place]) 10/23/2006  . HYPERLIPIDEMIA 10/23/2006  . ELECTROCARDIOGRAM, ABNORMAL 10/23/2006  . HYPERTENSION 07/23/2006   Current Outpatient Prescriptions on File Prior to Visit  Medication Sig Dispense Refill  . aspirin 81 MG tablet Take 81 mg by mouth daily.      . B-D ULTRAFINE III SHORT PEN 31G X 8 MM MISC USE WITH PEN INJECTIONS 100 each 0  . Blood Glucose Monitoring Suppl (ONE TOUCH ULTRA 2) W/DEVICE KIT 1 Device by Does not apply route once. 1 each 0  . doxycycline (VIBRA-TABS) 100 MG tablet Take 1 tablet (100 mg total) by mouth 2 (two) times daily. 20 tablet 0  . insulin aspart protamine - aspart (NOVOLOG MIX 70/30 FLEXPEN) (70-30) 100 UNIT/ML FlexPen Inject 60 units daily 30 mL 2  . losartan-hydrochlorothiazide (HYZAAR) 100-12.5 MG tablet Take 1 tablet by mouth daily. 30 tablet 5  . ONETOUCH DELICA LANCETS MISC Use as directed to check blood sugar twice daily dx 250.02 100 each 5  . sildenafil (VIAGRA) 100 MG tablet Take 0.5-1 tablets (50-100 mg total) by mouth daily as needed for erectile dysfunction. 3 tablet 3  . simvastatin (ZOCOR) 40 MG tablet Take 1 tablet (40 mg total) by mouth at  bedtime. 30 tablet 5  . terbinafine (LAMISIL) 1 % cream Apply 1 application topically at bedtime. Reported on 03/03/2015     No current facility-administered medications on file prior to visit.    No Known Allergies  Recent Results (from the past 2160 hour(s))  Basic metabolic panel     Status: Abnormal   Collection Time: 10/23/15 11:10 AM  Result Value Ref Range   Sodium 138 135 - 145 mEq/L   Potassium 3.7 3.5 - 5.1 mEq/L   Chloride 98 96 - 112 mEq/L   CO2 33 (H) 19 - 32 mEq/L   Glucose, Bld 240 (H) 70 - 99 mg/dL   BUN 16 6 - 23 mg/dL   Creatinine, Ser 1.35 0.40 - 1.50 mg/dL   Calcium 9.2 8.4 - 10.5 mg/dL   GFR 70.04 >60.00 mL/min  AST     Status: None   Collection Time: 10/23/15 11:10 AM  Result Value Ref Range   AST 11 0 - 37 U/L  ALT     Status: None   Collection Time: 10/23/15 11:10 AM  Result Value Ref Range   ALT 10 0 - 53 U/L  Lipid panel     Status: None   Collection Time: 10/23/15 11:10 AM  Result Value Ref Range   Cholesterol 156 0 - 200 mg/dL    Comment: ATP III Classification       Desirable:  < 200 mg/dL               Borderline  High:  200 - 239 mg/dL          High:  > = 240 mg/dL   Triglycerides 110.0 0.0 - 149.0 mg/dL    Comment: Normal:  <150 mg/dLBorderline High:  150 - 199 mg/dL   HDL 53.90 >39.00 mg/dL   VLDL 22.0 0.0 - 40.0 mg/dL   LDL Cholesterol 80 0 - 99 mg/dL   Total CHOL/HDL Ratio 3     Comment:                Men          Women1/2 Average Risk     3.4          3.3Average Risk          5.0          4.42X Average Risk          9.6          7.13X Average Risk          15.0          11.0                       NonHDL 101.87     Comment: NOTE:  Non-HDL goal should be 30 mg/dL higher than patient's LDL goal (i.e. LDL goal of < 70 mg/dL, would have non-HDL goal of < 100 mg/dL)    Objective: There were no vitals filed for this visit.  General: Patient is awake, alert, oriented x 3 and in no acute distress.  Dermatology: Skin is warm and dry bilateral  with full thickness ulceration present  Right hallux dorsal IPJ and distal tuft. Ulcerations Is prematurely healed at right hallux dorsal IPJ and measures 0.5 x 0.6 x 0.4 cm (last measurement 1.5 x 1 x 0.4 cm) at right hallux distal tuft, There is a Keratotic border with a fibro-granular base. The ulceration does not probe to bone. There is no malodor, no active drainage, no erythema, decreased  edema. No other acute signs of infection. Nails are short, thickened, dystrophic resembling onychomycosis.   Vascular: Dorsalis Pedis pulse = 2/4 Bilateral,  Posterior Tibial pulse = 1/4 Bilateral,  Capillary Fill Time < 5 seconds  Neurologic: Protective sensation intact using  the 5.07/10g Semmes Weinstein Monofilament.  Musculosketal:No Pain with palpation to ulcerated area right hallux. No pain with compression to calves bilateral. 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  Assessment and Plan:  Problem List Items Addressed This Visit      Endocrine   Diabetes ([place])    Other Visit Diagnoses    Toe ulcer, right, with fat layer exposed ([place])    -  Primary   Toe pain, right       Arthritis of big toe         -Examined patient and discussed the progression of the wound and treatment alternatives. -Previous Xrays reviewed - Excisionally dedbrided ulceration to healthy bleeding borders using a sterile15  blade. -Applied antibiotic cream and dry sterile dressing and instructed patient to continue with daily dressings at home consisting of the same. -Recommend patient to continue with postoperative shoe -Recommend patient to continue with doxycycline until completed - Advised patient to go to the ER or return to office if the wound worsens or if constitutional symptoms are present. -Patient to return to office in 2-3 weeks for follow  up care and evaluation or sooner if problems  arise. Discussed with patient, Once ulceration heals will benefit from IPJ fusion of right hallux. We will also consider getting an MRI prior to rule out any osseous changes that may be of concern of osteomyelitis at the rigidly contracted digit with history of ulceration.   Landis Martins, DPM

## 2015-11-28 ENCOUNTER — Ambulatory Visit: Payer: BLUE CROSS/BLUE SHIELD | Admitting: Sports Medicine

## 2015-12-12 ENCOUNTER — Encounter: Payer: Self-pay | Admitting: Sports Medicine

## 2015-12-12 ENCOUNTER — Ambulatory Visit (INDEPENDENT_AMBULATORY_CARE_PROVIDER_SITE_OTHER): Payer: BLUE CROSS/BLUE SHIELD | Admitting: Sports Medicine

## 2015-12-12 DIAGNOSIS — E119 Type 2 diabetes mellitus without complications: Secondary | ICD-10-CM | POA: Diagnosis not present

## 2015-12-12 DIAGNOSIS — M79674 Pain in right toe(s): Secondary | ICD-10-CM

## 2015-12-12 DIAGNOSIS — M19079 Primary osteoarthritis, unspecified ankle and foot: Secondary | ICD-10-CM

## 2015-12-12 DIAGNOSIS — Z794 Long term (current) use of insulin: Secondary | ICD-10-CM

## 2015-12-12 DIAGNOSIS — L97512 Non-pressure chronic ulcer of other part of right foot with fat layer exposed: Secondary | ICD-10-CM

## 2015-12-12 MED ORDER — DOXYCYCLINE HYCLATE 100 MG PO TABS: 100.0000 mg | ORAL_TABLET | Freq: Two times a day (BID) | ORAL | 0 refills | Status: DC

## 2015-12-12 NOTE — Progress Notes (Signed)
Subjective: Troy Becker is a 55 y.o. male patient seen in office for follow up evaluation of ulceration of the right hallux. Patient has a history of diabetes and a blood glucose level today not recorded.  Patient is changing the dressing using Neosporin every other day. Reports that he has not been wearing post op shoe and has been working with increased swelling to toe. Denies nausea/fever/vomiting/chills/night sweats/shortness of breath/pain. Patient  has no other pedal complaints at this time.  Patient Active Problem List   Diagnosis Date Noted  . PCP NOTES >>>>>>>>>>>>>>>>>> 10/23/2015  . Onychomycosis ? 06/03/2013  . Personal history of adenomatous colonic polyps 10/10/2011  . Annual physical exam 08/07/2011  . Encounter for long-term (current) use of other medications 08/05/2011  . Anxiety 07/21/2009  . ERECTILE DYSFUNCTION 11/05/2007  . Diabetes ([place]) 10/23/2006  . HYPERLIPIDEMIA 10/23/2006  . ELECTROCARDIOGRAM, ABNORMAL 10/23/2006  . HYPERTENSION 07/23/2006   Current Outpatient Prescriptions on File Prior to Visit  Medication Sig Dispense Refill  . aspirin 81 MG tablet Take 81 mg by mouth daily.      . B-D ULTRAFINE III SHORT PEN 31G X 8 MM MISC USE WITH PEN INJECTIONS 100 each 0  . Blood Glucose Monitoring Suppl (ONE TOUCH ULTRA 2) W/DEVICE KIT 1 Device by Does not apply route once. 1 each 0  . insulin aspart protamine - aspart (NOVOLOG MIX 70/30 FLEXPEN) (70-30) 100 UNIT/ML FlexPen Inject 60 units daily 30 mL 2  . losartan-hydrochlorothiazide (HYZAAR) 100-12.5 MG tablet Take 1 tablet by mouth daily. 30 tablet 5  . ONETOUCH DELICA LANCETS MISC Use as directed to check blood sugar twice daily dx 250.02 100 each 5  . sildenafil (VIAGRA) 100 MG tablet Take 0.5-1 tablets (50-100 mg total) by mouth daily as needed for erectile dysfunction. 3 tablet 3  . simvastatin (ZOCOR) 40 MG tablet Take 1 tablet (40 mg total) by mouth at bedtime. 30 tablet 5  . terbinafine (LAMISIL) 1 %  cream Apply 1 application topically at bedtime. Reported on 03/03/2015     No current facility-administered medications on file prior to visit.    No Known Allergies  Recent Results (from the past 2160 hour(s))  Basic metabolic panel     Status: Abnormal   Collection Time: 10/23/15 11:10 AM  Result Value Ref Range   Sodium 138 135 - 145 mEq/L   Potassium 3.7 3.5 - 5.1 mEq/L   Chloride 98 96 - 112 mEq/L   CO2 33 (H) 19 - 32 mEq/L   Glucose, Bld 240 (H) 70 - 99 mg/dL   BUN 16 6 - 23 mg/dL   Creatinine, Ser 1.35 0.40 - 1.50 mg/dL   Calcium 9.2 8.4 - 10.5 mg/dL   GFR 70.04 >60.00 mL/min  AST     Status: None   Collection Time: 10/23/15 11:10 AM  Result Value Ref Range   AST 11 0 - 37 U/L  ALT     Status: None   Collection Time: 10/23/15 11:10 AM  Result Value Ref Range   ALT 10 0 - 53 U/L  Lipid panel     Status: None   Collection Time: 10/23/15 11:10 AM  Result Value Ref Range   Cholesterol 156 0 - 200 mg/dL    Comment: ATP III Classification       Desirable:  < 200 mg/dL               Borderline High:  200 - 239 mg/dL  High:  > = 240 mg/dL   Triglycerides 110.0 0.0 - 149.0 mg/dL    Comment: Normal:  <150 mg/dLBorderline High:  150 - 199 mg/dL   HDL 53.90 >39.00 mg/dL   VLDL 22.0 0.0 - 40.0 mg/dL   LDL Cholesterol 80 0 - 99 mg/dL   Total CHOL/HDL Ratio 3     Comment:                Men          Women1/2 Average Risk     3.4          3.3Average Risk          5.0          4.42X Average Risk          9.6          7.13X Average Risk          15.0          11.0                       NonHDL 101.87     Comment: NOTE:  Non-HDL goal should be 30 mg/dL higher than patient's LDL goal (i.e. LDL goal of < 70 mg/dL, would have non-HDL goal of < 100 mg/dL)    Objective: There were no vitals filed for this visit.  General: Patient is awake, alert, oriented x 3 and in no acute distress. Appear to be in complete denial about foot ulceration and risks and is found to be laughing  during appointment as I am discussing risks of amputation if he continues with non-compliance.   Dermatology: Skin is warm and dry bilateral with full thickness ulceration present  Right hallux dorsal IPJ and distal tuft. Ulcerations Is prematurely healed at right hallux dorsal IPJ and measures 3x1x 0.4 cm (last measurement 0.5 x 0.6 x 0.4 cm) at right hallux distal tuft with increased blistered skin surrounding with mixed Keratotic border with a fibro-granular base. The ulceration does not probe to bone. There is mild malodor, no active drainage, no erythema, mild edema. No other acute signs of infection. Nails are short, thickened, dystrophic resembling onychomycosis.   Vascular: Dorsalis Pedis pulse = 2/4 Bilateral,  Posterior Tibial pulse = 1/4 Bilateral,  Capillary Fill Time < 5 seconds  Neurologic: Protective sensation intact using  the 5.07/10g Semmes Weinstein Monofilament.  Musculosketal:No Pain with palpation to ulcerated area right hallux. No pain with compression to calves bilateral. 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  Assessment and Plan:  Problem List Items Addressed This Visit      Endocrine   Diabetes ([place])    Other Visit Diagnoses    Toe ulcer, right, with fat layer exposed ([place])    -  Primary   Toe pain, right       Arthritis of big toe         -Examined patient and discussed the progression of the wound and treatment alternatives. -Previous Xrays reviewed -Rx MRI to eval for osteomyelitis at distal tuft is setting of nonhealing ulceation  - Excisionally dedbrided ulceration to healthy bleeding borders using a sterile15  blade. -Applied iodosorb and dry sterile dressing and instructed patient to continue with daily dressings at home consisting of the same. -Recommend patient to continue with postoperative shoe; Work note given to wear shoe  if can  not accommodate recommend no work until ulceration is healed -Refilled doxycycline  - Advised patient to go to the ER or return to office if the wound worsens or if constitutional symptoms are present. -Patient to return to office in 2 weeks or after MRI for follow up care and evaluation or sooner if problems arise. Discussed with patient, the reality of hallux amputation if he does not comply. Patient expressed understanding.   Landis Martins, DPM

## 2015-12-13 ENCOUNTER — Telehealth: Payer: Self-pay | Admitting: *Deleted

## 2015-12-13 DIAGNOSIS — M19079 Primary osteoarthritis, unspecified ankle and foot: Secondary | ICD-10-CM

## 2015-12-13 DIAGNOSIS — Z79899 Other long term (current) drug therapy: Secondary | ICD-10-CM

## 2015-12-13 DIAGNOSIS — M79674 Pain in right toe(s): Secondary | ICD-10-CM

## 2015-12-13 DIAGNOSIS — L97512 Non-pressure chronic ulcer of other part of right foot with fat layer exposed: Secondary | ICD-10-CM

## 2015-12-13 NOTE — Telephone Encounter (Addendum)
-----   Message from [place], [place] sent at 12/12/2015  5:48 PM EDT ----- Regarding: MRI Right foot Chronic ulceration right hallux distal tuft plantar and dorsal IPJ evaluate for osteomyelitis  -Dr. Cannon Kettle. 12/13/2015-Faxed orders to [email or web address] Kitchen

## 2015-12-26 ENCOUNTER — Ambulatory Visit: Payer: BLUE CROSS/BLUE SHIELD | Admitting: Sports Medicine

## 2015-12-27 ENCOUNTER — Other Ambulatory Visit: Payer: Self-pay

## 2015-12-29 ENCOUNTER — Ambulatory Visit (INDEPENDENT_AMBULATORY_CARE_PROVIDER_SITE_OTHER): Payer: BLUE CROSS/BLUE SHIELD | Admitting: Sports Medicine

## 2015-12-29 ENCOUNTER — Encounter: Payer: Self-pay | Admitting: Sports Medicine

## 2015-12-29 DIAGNOSIS — M19079 Primary osteoarthritis, unspecified ankle and foot: Secondary | ICD-10-CM

## 2015-12-29 DIAGNOSIS — M79674 Pain in right toe(s): Secondary | ICD-10-CM

## 2015-12-29 DIAGNOSIS — Z794 Long term (current) use of insulin: Secondary | ICD-10-CM | POA: Diagnosis not present

## 2015-12-29 DIAGNOSIS — L97512 Non-pressure chronic ulcer of other part of right foot with fat layer exposed: Secondary | ICD-10-CM | POA: Diagnosis not present

## 2015-12-29 DIAGNOSIS — E119 Type 2 diabetes mellitus without complications: Secondary | ICD-10-CM | POA: Diagnosis not present

## 2015-12-29 MED ORDER — DOXYCYCLINE HYCLATE 100 MG PO TABS: 100.0000 mg | ORAL_TABLET | Freq: Two times a day (BID) | ORAL | 0 refills | Status: DC

## 2015-12-29 NOTE — Progress Notes (Signed)
Subjective: Troy Becker is a 55 y.o. male patient seen in office for follow up evaluation of ulceration of the right hallux. Patient has a history of diabetes and a blood glucose level today not recorded.  Patient is changing the dressing using Iodosorb daily. Reports that he has not been wearing post op shoe and has been working with increased swelling to toe and will need a work note. States that at one point he thought the ulcer was doing better and decided to go for a recreational walk. Denies nausea/fever/vomiting/chills/night sweats/shortness of breath/pain. Patient  has no other pedal complaints at this time.  Patient Active Problem List   Diagnosis Date Noted  . PCP NOTES >>>>>>>>>>>>>>>>>> 10/23/2015  . Onychomycosis ? 06/03/2013  . Personal history of adenomatous colonic polyps 10/10/2011  . Annual physical exam 08/07/2011  . Encounter for long-term (current) use of other medications 08/05/2011  . Anxiety 07/21/2009  . ERECTILE DYSFUNCTION 11/05/2007  . Diabetes (Troy Becker) 10/23/2006  . HYPERLIPIDEMIA 10/23/2006  . ELECTROCARDIOGRAM, ABNORMAL 10/23/2006  . HYPERTENSION 07/23/2006   Current Outpatient Prescriptions on File Prior to Visit  Medication Sig Dispense Refill  . aspirin 81 MG tablet Take 81 mg by mouth daily.      . B-D ULTRAFINE III SHORT PEN 31G X 8 MM MISC USE WITH PEN INJECTIONS 100 each 0  . Blood Glucose Monitoring Suppl (ONE TOUCH ULTRA 2) W/DEVICE KIT 1 Device by Does not apply route once. 1 each 0  . insulin aspart protamine - aspart (NOVOLOG MIX 70/30 FLEXPEN) (70-30) 100 UNIT/ML FlexPen Inject 60 units daily 30 mL 2  . losartan-hydrochlorothiazide (HYZAAR) 100-12.5 MG tablet Take 1 tablet by mouth daily. 30 tablet 5  . ONETOUCH DELICA LANCETS MISC Use as directed to check blood sugar twice daily dx 250.02 100 each 5  . sildenafil (VIAGRA) 100 MG tablet Take 0.5-1 tablets (50-100 mg total) by mouth daily as needed for erectile dysfunction. 3 tablet 3  .  simvastatin (ZOCOR) 40 MG tablet Take 1 tablet (40 mg total) by mouth at bedtime. 30 tablet 5  . terbinafine (LAMISIL) 1 % cream Apply 1 application topically at bedtime. Reported on 03/03/2015     No current facility-administered medications on file prior to visit.    No Known Allergies  Recent Results (from the past 2160 hour(s))  Basic metabolic panel     Status: Abnormal   Collection Time: 10/23/15 11:10 AM  Result Value Ref Range   Sodium 138 135 - 145 mEq/L   Potassium 3.7 3.5 - 5.1 mEq/L   Chloride 98 96 - 112 mEq/L   CO2 33 (H) 19 - 32 mEq/L   Glucose, Bld 240 (H) 70 - 99 mg/dL   BUN 16 6 - 23 mg/dL   Creatinine, Ser 1.35 0.40 - 1.50 mg/dL   Calcium 9.2 8.4 - 10.5 mg/dL   GFR 70.04 >60.00 mL/min  AST     Status: None   Collection Time: 10/23/15 11:10 AM  Result Value Ref Range   AST 11 0 - 37 U/L  ALT     Status: None   Collection Time: 10/23/15 11:10 AM  Result Value Ref Range   ALT 10 0 - 53 U/L  Lipid panel     Status: None   Collection Time: 10/23/15 11:10 AM  Result Value Ref Range   Cholesterol 156 0 - 200 mg/dL    Comment: ATP III Classification       Desirable:  < 200 mg/dL  Borderline High:  200 - 239 mg/dL          High:  > = 240 mg/dL   Triglycerides 110.0 0.0 - 149.0 mg/dL    Comment: Normal:  <150 mg/dLBorderline High:  150 - 199 mg/dL   HDL 53.90 >39.00 mg/dL   VLDL 22.0 0.0 - 40.0 mg/dL   LDL Cholesterol 80 0 - 99 mg/dL   Total CHOL/HDL Ratio 3     Comment:                Men          Women1/2 Average Risk     3.4          3.3Average Risk          5.0          4.42X Average Risk          9.6          7.13X Average Risk          15.0          11.0                       NonHDL 101.87     Comment: NOTE:  Non-HDL goal should be 30 mg/dL higher than patient's LDL goal (i.e. LDL goal of < 70 mg/dL, would have non-HDL goal of < 100 mg/dL)    Objective: There were no vitals filed for this visit.  General: Patient is awake, alert, oriented x 3  and in no acute distress. Appear to be in complete denial about foot ulceration;he continues with non-compliance.   Dermatology: Skin is warm and dry bilateral with full thickness ulceration present Right hallux dorsal IPJ and distal tuft. Ulcerations Is prematurely healed at right hallux dorsal IPJ and measures 2.8x1x 0.4 cm (last measurement larger) at right hallux distal tuft with mild Keratotic border with a fibro-granular base. The ulceration does not probe to bone. There is no malodor, no active drainage, no erythema, mild edema. No other acute signs of infection. Nails are short, thickened, dystrophic resembling onychomycosis.   Vascular: Dorsalis Pedis pulse = 2/4 Bilateral,  Posterior Tibial pulse = 1/4 Bilateral,  Capillary Fill Time < 5 seconds  Neurologic: Protective sensation intact using  the 5.07/10g Semmes Weinstein Monofilament.  Musculosketal:No Pain with palpation to ulcerated area right hallux. No pain with compression to calves bilateral. 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  Assessment and Plan:  Problem List Items Addressed This Visit    None    Visit Diagnoses    Right foot ulcer, with fat layer exposed ([place]roy Becker)    -  Primary   Relevant Medications   doxycycline (VIBRA-TABS) 100 MG tablet   Pain in toe of right foot       Relevant Medications   doxycycline (VIBRA-TABS) 100 MG tablet   Arthritis of big toe       Relevant Medications   doxycycline (VIBRA-TABS) 100 MG tablet   Type 2 diabetes mellitus without complication, with long-term current use of insulin ([place])         -Examined patient and discussed the progression of the wound and treatment alternatives. -Previous Xrays reviewed -Patient is awaiting MRI to eval for osteomyelitis at distal tuft is setting of nonhealing ulceation  - Cleansed ulceration and applied iodosorb and dry  sterile dressing and instructed  patient to continue with daily dressings at home consisting of the same. -Recommend patient to continue with postoperative shoe; Work note given to wear shoe if can not accommodate recommend no work until ulceration is healed -Refilled doxycycline for preventative measures  - Advised patient to go to the ER or return to office if the wound worsens or if constitutional symptoms are present. -Patient to return to office in 2 weeks or after MRI for follow up care and evaluation or sooner if problems arise. Discussed with patient, the reality of hallux amputation if he does not comply. Patient expressed understanding. However, still continues to be noncompliant.   Landis Martins, DPM

## 2016-01-02 ENCOUNTER — Ambulatory Visit: Payer: BLUE CROSS/BLUE SHIELD | Admitting: Sports Medicine

## 2016-01-09 ENCOUNTER — Ambulatory Visit: Payer: BLUE CROSS/BLUE SHIELD | Admitting: Sports Medicine

## 2016-01-11 ENCOUNTER — Ambulatory Visit: Payer: BLUE CROSS/BLUE SHIELD | Admitting: Sports Medicine

## 2016-01-18 ENCOUNTER — Ambulatory Visit (INDEPENDENT_AMBULATORY_CARE_PROVIDER_SITE_OTHER): Payer: BLUE CROSS/BLUE SHIELD | Admitting: Sports Medicine

## 2016-01-18 ENCOUNTER — Encounter: Payer: Self-pay | Admitting: Sports Medicine

## 2016-01-18 DIAGNOSIS — E119 Type 2 diabetes mellitus without complications: Secondary | ICD-10-CM

## 2016-01-18 DIAGNOSIS — L97512 Non-pressure chronic ulcer of other part of right foot with fat layer exposed: Secondary | ICD-10-CM

## 2016-01-18 DIAGNOSIS — M19079 Primary osteoarthritis, unspecified ankle and foot: Secondary | ICD-10-CM

## 2016-01-18 DIAGNOSIS — M79674 Pain in right toe(s): Secondary | ICD-10-CM

## 2016-01-18 DIAGNOSIS — Z794 Long term (current) use of insulin: Secondary | ICD-10-CM

## 2016-01-18 NOTE — Progress Notes (Signed)
Subjective: Troy Becker is a 55 y.o. male patient seen in office for follow up evaluation of ulceration of the right hallux. Patient has a history of diabetes and a blood glucose level today not recorded.  Patient is changing the dressing using Iodosorb daily. Reports that he has been doing something bad and has been cleaning the wound with peroxide and alcohol and is wondering if this is contributing to why the wound is not healing. Denies nausea/fever/vomiting/chills/night sweats/shortness of breath/pain. Patient  has no other pedal complaints at this time.  Patient Active Problem List   Diagnosis Date Noted  . PCP NOTES >>>>>>>>>>>>>>>>>> 10/23/2015  . Onychomycosis ? 06/03/2013  . Personal history of adenomatous colonic polyps 10/10/2011  . Annual physical exam 08/07/2011  . Encounter for long-term (current) use of other medications 08/05/2011  . Anxiety 07/21/2009  . ERECTILE DYSFUNCTION 11/05/2007  . Diabetes ([place]) 10/23/2006  . HYPERLIPIDEMIA 10/23/2006  . ELECTROCARDIOGRAM, ABNORMAL 10/23/2006  . HYPERTENSION 07/23/2006   Current Outpatient Prescriptions on File Prior to Visit  Medication Sig Dispense Refill  . aspirin 81 MG tablet Take 81 mg by mouth daily.      . B-D ULTRAFINE III SHORT PEN 31G X 8 MM MISC USE WITH PEN INJECTIONS 100 each 0  . Blood Glucose Monitoring Suppl (ONE TOUCH ULTRA 2) W/DEVICE KIT 1 Device by Does not apply route once. 1 each 0  . doxycycline (VIBRA-TABS) 100 MG tablet Take 1 tablet (100 mg total) by mouth 2 (two) times daily. 20 tablet 0  . insulin aspart protamine - aspart (NOVOLOG MIX 70/30 FLEXPEN) (70-30) 100 UNIT/ML FlexPen Inject 60 units daily 30 mL 2  . losartan-hydrochlorothiazide (HYZAAR) 100-12.5 MG tablet Take 1 tablet by mouth daily. 30 tablet 5  . ONETOUCH DELICA LANCETS MISC Use as directed to check blood sugar twice daily dx 250.02 100 each 5  . sildenafil (VIAGRA) 100 MG tablet Take 0.5-1 tablets (50-100 mg total) by mouth daily  as needed for erectile dysfunction. 3 tablet 3  . simvastatin (ZOCOR) 40 MG tablet Take 1 tablet (40 mg total) by mouth at bedtime. 30 tablet 5  . terbinafine (LAMISIL) 1 % cream Apply 1 application topically at bedtime. Reported on 03/03/2015     No current facility-administered medications on file prior to visit.    No Known Allergies  Recent Results (from the past 2160 hour(s))  Basic metabolic panel     Status: Abnormal   Collection Time: 10/23/15 11:10 AM  Result Value Ref Range   Sodium 138 135 - 145 mEq/L   Potassium 3.7 3.5 - 5.1 mEq/L   Chloride 98 96 - 112 mEq/L   CO2 33 (H) 19 - 32 mEq/L   Glucose, Bld 240 (H) 70 - 99 mg/dL   BUN 16 6 - 23 mg/dL   Creatinine, Ser 1.35 0.40 - 1.50 mg/dL   Calcium 9.2 8.4 - 10.5 mg/dL   GFR 70.04 >60.00 mL/min  AST     Status: None   Collection Time: 10/23/15 11:10 AM  Result Value Ref Range   AST 11 0 - 37 U/L  ALT     Status: None   Collection Time: 10/23/15 11:10 AM  Result Value Ref Range   ALT 10 0 - 53 U/L  Lipid panel     Status: None   Collection Time: 10/23/15 11:10 AM  Result Value Ref Range   Cholesterol 156 0 - 200 mg/dL    Comment: ATP III Classification  Desirable:  < 200 mg/dL               Borderline High:  200 - 239 mg/dL          High:  > = 240 mg/dL   Triglycerides 110.0 0.0 - 149.0 mg/dL    Comment: Normal:  <150 mg/dLBorderline High:  150 - 199 mg/dL   HDL 53.90 >39.00 mg/dL   VLDL 22.0 0.0 - 40.0 mg/dL   LDL Cholesterol 80 0 - 99 mg/dL   Total CHOL/HDL Ratio 3     Comment:                Men          Women1/2 Average Risk     3.4          3.3Average Risk          5.0          4.42X Average Risk          9.6          7.13X Average Risk          15.0          11.0                       NonHDL 101.87     Comment: NOTE:  Non-HDL goal should be 30 mg/dL higher than patient's LDL goal (i.e. LDL goal of < 70 mg/dL, would have non-HDL goal of < 100 mg/dL)    Objective: There were no vitals filed for this  visit.  General: Patient is awake, alert, oriented x 3 and in no acute distress. Appear to be in complete denial about foot ulceration;he continues with non-compliance.   Dermatology: Skin is warm and dry bilateral with full thickness ulceration present Right hallux dorsal IPJ and distal tuft. Ulceration Is prematurely healed at right hallux dorsal IPJ . However, still open at right hallux plantar aspect distal tuft that measures 0.8x0.5x 0.3 cm (last measurement2.8x1x 0.4 cm) with a fibro-granular base. The ulceration does not probe to bone. There is no malodor, no active drainage, no erythema, mild edema. No other acute signs of infection. Nails are short, thickened, dystrophic resembling onychomycosis.   Vascular: Dorsalis Pedis pulse = 2/4 Bilateral,  Posterior Tibial pulse = 1/4 Bilateral,  Capillary Fill Time < 5 seconds  Neurologic: Protective sensation intact using  the 5.07/10g Semmes Weinstein Monofilament.  Musculosketal:No Pain with palpation to ulcerated area right hallux. No pain with compression to calves bilateral. 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  Assessment and Plan:  Problem List Items Addressed This Visit    None    Visit Diagnoses    Right foot ulcer, with fat layer exposed ([place])    -  Primary   Pain in toe of right foot       Arthritis of big toe       Type 2 diabetes mellitus without complication, with long-term current use of insulin ([place])         -Examined patient and discussed the progression of the wound and treatment alternatives. -Patient is awaiting MRI to eval for osteomyelitis at distal tuft is setting of nonhealing ulceation  - Cleansed ulceration and applied iodosorb and dry sterile dressing and instructed patient to continue with daily dressings at home consisting of the same. -Recommend patient to continue with postoperative shoe -Doxycycline completed - Advised  patient  to go to the ER or return to office if the wound worsens or if constitutional symptoms are present. -Patient to return to office in 2 weeks or after MRI for follow up care and evaluation or sooner if problems arise. Discussed with patient, the reality of hallux amputation if he does not comply. Patient expressed understanding. However, still continues to be noncompliant and still chooses to deviate from recommended wound care which is likely contributing to prolonged ulceration.   Landis Martins, DPM

## 2016-02-13 ENCOUNTER — Ambulatory Visit (INDEPENDENT_AMBULATORY_CARE_PROVIDER_SITE_OTHER): Payer: BLUE CROSS/BLUE SHIELD

## 2016-02-13 ENCOUNTER — Ambulatory Visit (INDEPENDENT_AMBULATORY_CARE_PROVIDER_SITE_OTHER): Payer: BLUE CROSS/BLUE SHIELD | Admitting: Podiatry

## 2016-02-13 ENCOUNTER — Encounter: Payer: Self-pay | Admitting: Sports Medicine

## 2016-02-13 DIAGNOSIS — L97512 Non-pressure chronic ulcer of other part of right foot with fat layer exposed: Secondary | ICD-10-CM | POA: Diagnosis not present

## 2016-02-13 DIAGNOSIS — M19079 Primary osteoarthritis, unspecified ankle and foot: Secondary | ICD-10-CM

## 2016-02-13 DIAGNOSIS — M79674 Pain in right toe(s): Secondary | ICD-10-CM

## 2016-02-13 DIAGNOSIS — M86171 Other acute osteomyelitis, right ankle and foot: Secondary | ICD-10-CM | POA: Diagnosis not present

## 2016-02-13 MED ORDER — DOXYCYCLINE HYCLATE 100 MG PO TABS: 100.0000 mg | ORAL_TABLET | Freq: Two times a day (BID) | ORAL | 0 refills | Status: DC

## 2016-02-14 NOTE — Progress Notes (Signed)
Subjective: 55 year old male presents the office today for follow-up evaluation of ulcer to the right toe which has been ongoing for the last couple months. He was previously prescribed doxycycline however he states he took this for 1 day and stop the medication. He states he has noticed this toe is becoming swollen. He also states that he is been very active on his feet doing a lot of walking. The swelling has worsened this week. He denies any redness or warmth of the foot. Denies any drainage or pus coming from the wound. He is continues wear surgical shoe. Denies any systemic complaints such as fevers, chills, nausea, vomiting. No acute changes since last appointment, and no other complaints at this time.   Objective: AAO x3, NAD DP/PT pulses palpable bilaterally, CRT less than 3 seconds To the distal plantar aspect of the right hallux is a wound with some overlying macerated issue. There appears to be a dried blister around the wound the distal aspect of the toe. Upon debridement the wound measures 1.5 x 1 x 0.9 cm and probes very close to bone. Unable to express any drainage or pus. There is edema to the hallux. There is no erythema or increase in warmth. There is no ascending cellulitis. No open lesions or pre-ulcerative lesions.  No pain with calf compression, swelling, warmth, erythema  Assessment: Right hallux ulceration with likely osteomyelitis  Plan: -All treatment options discussed with the patient including all alternatives, risks, complications.  -X-rays obtained and reviewed. There is changes the distal portion of the distal phalanx of the hallux consistent with osteomyelitis. -patient did not get an MRI as ordered. -Wound was debrided today to granular wound base. Silvadene was applied followed by dressing. Recommended continue with daily dressing changes as previous. -I refilled doxycycline and strongly encouraged him to restart antibiotics. -Given the nature of the toe today as  well as the x-ray findings discussed with him amputation of the toe. He wishes to hold off on this. Discussed with him to monitor closely for any signs or symptoms of worsening infection and he is to go immediately to the emergency room should any occur. -Follow-up with Dr. Cannon Kettle in 1 week or sooner if any issues are to arise. -Patient encouraged to call the office with any questions, concerns, change in symptoms.   Celesta Gentile, DPM

## 2016-02-15 ENCOUNTER — Telehealth: Payer: Self-pay | Admitting: Internal Medicine

## 2016-02-15 DIAGNOSIS — M869 Osteomyelitis, unspecified: Secondary | ICD-10-CM

## 2016-02-15 NOTE — Telephone Encounter (Signed)
[facility] Dr. Victorino December will work pt in 8:00am tomorrow morning 02/16/16. They need imaging faxed today if at all possible to [phone] Vilma Prader was unable to locate). I have faxed the OV notes from [facility] 12/12/15-current.   LM for pt to return my call regarding appt to make sure he is able to go.

## 2016-02-15 NOTE — Telephone Encounter (Signed)
LMOM from podiatrist reviewed, he has osteomyelitis, advise patient regarding the serious nature of the condition, if not taken care of quickly it may get worse and require amputations. If he is not quickly improve  may consider see infectious diseases or orthopedic surgery. Also, is extremely important he sees [name] for better control of his diabetes. Requested a call back.

## 2016-02-15 NOTE — Telephone Encounter (Signed)
Patient aware of appointment and will be at the appointment at 8:00am tomorrow.

## 2016-02-15 NOTE — Telephone Encounter (Signed)
Spoke with the patient. We agreed to refer to orthopedic surgery-- DX osteomyelitis , foot. Also, the patient tearful during the phone conversation, denies suicidal ideas. I counseled throughout our phone conversation, offered to set up visit here and start medication, for now he declined. Knows to call me anytime if the stress increase or if he needs to talk with somebody. Has an appointment to see me in January and plans to keep it. Plans to call endocrinology and set an appointment.

## 2016-02-15 NOTE — Telephone Encounter (Signed)
Ortho referral placed

## 2016-02-22 ENCOUNTER — Ambulatory Visit (INDEPENDENT_AMBULATORY_CARE_PROVIDER_SITE_OTHER): Payer: BLUE CROSS/BLUE SHIELD | Admitting: Sports Medicine

## 2016-02-22 ENCOUNTER — Encounter: Payer: Self-pay | Admitting: Sports Medicine

## 2016-02-22 ENCOUNTER — Telehealth: Payer: Self-pay | Admitting: Internal Medicine

## 2016-02-22 DIAGNOSIS — E119 Type 2 diabetes mellitus without complications: Secondary | ICD-10-CM

## 2016-02-22 DIAGNOSIS — L97514 Non-pressure chronic ulcer of other part of right foot with necrosis of bone: Secondary | ICD-10-CM | POA: Diagnosis not present

## 2016-02-22 DIAGNOSIS — Z794 Long term (current) use of insulin: Secondary | ICD-10-CM

## 2016-02-22 DIAGNOSIS — M79674 Pain in right toe(s): Secondary | ICD-10-CM

## 2016-02-22 DIAGNOSIS — M86171 Other acute osteomyelitis, right ankle and foot: Secondary | ICD-10-CM | POA: Diagnosis not present

## 2016-02-22 DIAGNOSIS — M19079 Primary osteoarthritis, unspecified ankle and foot: Secondary | ICD-10-CM

## 2016-02-22 MED ORDER — DOXYCYCLINE HYCLATE 100 MG PO TABS: 100.0000 mg | ORAL_TABLET | Freq: Two times a day (BID) | ORAL | 0 refills | Status: DC

## 2016-02-22 NOTE — Telephone Encounter (Addendum)
Relation to pt: self  Call back number: [phone]  Pharmacy:  Reason for call:  Patient states blood pressure has been unsteady for the past month and would like to discuss blood pressure medication, patient declined appointment stating he's scheduled for a physical appointment 03/07/15 please advise

## 2016-02-22 NOTE — Telephone Encounter (Signed)
Take hyzaar qd as recomended  ADD amlodipine 5 mg 1 po qd #30 and 3 RF Call w/ BP readings next week Keep f/u in few days, call any time if sx

## 2016-02-22 NOTE — Telephone Encounter (Signed)
Please call patient, I need to know what kind of readings he is getting. If he cannot tell, recommend to check BP daily, call for readings in one week. Also,  Does he have any symptoms related to BPs such as headaches?Marland Kitchen

## 2016-02-22 NOTE — Telephone Encounter (Signed)
Spoke w/ Pt, the last 2-3 months his blood pressure has been elevated. He didn't have BP logs but he normally does check BP three times daily. This afternoon BP was 198/90. Pt denies any sx's such as HA, SOB, CP, also denies bilateral foot/leg swelling (he does have R foot swelling but believes this is due to infection he has). He is currently being treated for R foot infection, he is unsure if this has caused the increase to his BP's. He is watching his salt intake and diet. Informed Pt I would send information to PCP and call back with advice. Pt aware PCP is out of office until tomorrow and understands to call or go to ED if he becomes symptomatic or his BP's keep increasing.

## 2016-02-22 NOTE — Progress Notes (Signed)
Subjective: Troy Becker is a 55 y.o. male patient seen in office for follow up evaluation of ulceration of the right hallux. Patient has a history of diabetes and a blood glucose level today not recorded.  Patient is changing the dressing using Iodosorb sometimes. Reports that he has been doing something bad and not wearing his shoe at all times. States that he is taking his antibiotics and has MRI scheduled. Denies nausea/fever/vomiting/chills/night sweats/shortness of breath/pain. Patient  has no other pedal complaints at this time.  Patient Active Problem List   Diagnosis Date Noted  . PCP NOTES >>>>>>>>>>>>>>>>>> 10/23/2015  . Onychomycosis ? 06/03/2013  . Personal history of adenomatous colonic polyps 10/10/2011  . Annual physical exam 08/07/2011  . Encounter for long-term (current) use of other medications 08/05/2011  . Anxiety 07/21/2009  . ERECTILE DYSFUNCTION 11/05/2007  . Diabetes ([place]) 10/23/2006  . HYPERLIPIDEMIA 10/23/2006  . ELECTROCARDIOGRAM, ABNORMAL 10/23/2006  . HYPERTENSION 07/23/2006   Current Outpatient Prescriptions on File Prior to Visit  Medication Sig Dispense Refill  . aspirin 81 MG tablet Take 81 mg by mouth daily.      . B-D ULTRAFINE III SHORT PEN 31G X 8 MM MISC USE WITH PEN INJECTIONS 100 each 0  . Blood Glucose Monitoring Suppl (ONE TOUCH ULTRA 2) W/DEVICE KIT 1 Device by Does not apply route once. 1 each 0  . insulin aspart protamine - aspart (NOVOLOG MIX 70/30 FLEXPEN) (70-30) 100 UNIT/ML FlexPen Inject 60 units daily 30 mL 2  . losartan-hydrochlorothiazide (HYZAAR) 100-12.5 MG tablet Take 1 tablet by mouth daily. 30 tablet 5  . ONETOUCH DELICA LANCETS MISC Use as directed to check blood sugar twice daily dx 250.02 100 each 5  . sildenafil (VIAGRA) 100 MG tablet Take 0.5-1 tablets (50-100 mg total) by mouth daily as needed for erectile dysfunction. 3 tablet 3  . simvastatin (ZOCOR) 40 MG tablet Take 1 tablet (40 mg total) by mouth at bedtime. 30  tablet 5  . terbinafine (LAMISIL) 1 % cream Apply 1 application topically at bedtime. Reported on 03/03/2015     No current facility-administered medications on file prior to visit.    No Known Allergies  No results found for this or any previous visit (from the past 2160 hour(s)).  Objective: There were no vitals filed for this visit.  General: Patient is awake, alert, oriented x 3 and in no acute distress. Appear to be in complete denial about foot ulceration and lack of understanding;he continues with non-compliance.   Dermatology: Skin is warm and dry bilateral with full thickness ulceration present Right hallux dorsal IPJ and distal tuft. Ulceration Is prematurely healed at right hallux dorsal IPJ . However, still open at right hallux plantar aspect distal tuft that measures 3x3.5x 0.5 cm to bone (last measurement 0.8x0.5x 0.3 cm) with a fibrotic base and periwound keratosis and maceration. There is lysis of hallux nail.There is slight malodor, no active drainage, mild erythema, mild edema. No other acute signs of infection. Nails are short, thickened, dystrophic resembling onychomycosis.   Vascular: Dorsalis Pedis pulse = 2/4 Bilateral,  Posterior Tibial pulse = 1/4 Bilateral,  Capillary Fill Time < 5 seconds  Neurologic: Protective sensation intact using  the 5.07/10g Semmes Weinstein Monofilament.  Musculosketal:No Pain with palpation to ulcerated area right hallux. No pain with compression to calves bilateral. 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  Assessment and Plan:  Problem List Items Addressed This Visit  Endocrine   Diabetes ([place])    Other Visit Diagnoses    Pain in toe of right foot    -  Primary   Relevant Medications   doxycycline (VIBRA-TABS) 100 MG tablet   Chronic ulcer of right great toe, with necrosis of bone ([facility])       Relevant Medications   doxycycline (VIBRA-TABS) 100 MG  tablet   Other acute osteomyelitis of right foot ([facility])       Relevant Medications   doxycycline (VIBRA-TABS) 100 MG tablet   Arthritis of big toe         -Examined patient and discussed the progression of the wound and treatment alternatives. -Patient is awaiting MRI to eval for osteomyelitis at distal tuft is setting of nonhealing ulceration; reports he is to get MRI Friday after next   -Debrided all nonviable tissue and loose nail. Cleansed ulceration and applied iodosorb and dry sterile dressing and instructed patient to continue with daily dressings at home consisting of the same or betadine if he runs out of Iodosorb. May change twice daily if there is drainage and use wound cleanser. -Recommend patient to continue with postoperative shoe at all times -Refilled Doxycycline  - Advised patient to go to the ER or return to office if the wound worsens or if constitutional symptoms are present. -Patient to return to office in 2 weeks or after MRI for follow up care and evaluation or sooner if problems arise. Discussed with patient, the reality of hallux amputation if osteomyelitis is present. Encouraged patient to bring wife with him to next office visit. Patient expressed understanding. However, still continues to be noncompliant and still chooses to deviate from recommended wound care which is likely contributing to prolonged ulceration and worsening presentation.   Landis Martins, DPM

## 2016-02-23 ENCOUNTER — Other Ambulatory Visit: Payer: Self-pay | Admitting: Endocrinology

## 2016-02-23 MED ORDER — AMLODIPINE BESYLATE 5 MG PO TABS: 5.0000 mg | ORAL_TABLET | Freq: Every day | ORAL | 3 refills | Status: DC

## 2016-02-23 NOTE — Telephone Encounter (Signed)
LMOM informing Pt of recommendations. Amlodipine 5mg  sent to [facility]. Instructed Pt to call next week w/ BP readings and keep scheduled appt for 03/06/2016. Instructed Pt to call if sx's, questions/concerns.

## 2016-03-04 HISTORY — PX: CATARACT EXTRACTION W/ INTRAOCULAR LENS  IMPLANT, BILATERAL: SHX1307

## 2016-03-06 ENCOUNTER — Ambulatory Visit (INDEPENDENT_AMBULATORY_CARE_PROVIDER_SITE_OTHER): Payer: BLUE CROSS/BLUE SHIELD | Admitting: Internal Medicine

## 2016-03-06 ENCOUNTER — Encounter: Payer: Self-pay | Admitting: Internal Medicine

## 2016-03-06 VITALS — BP 142/86 | HR 85 | Temp 98.4°F | Resp 14 | Ht 71.0 in | Wt 238.4 lb

## 2016-03-06 DIAGNOSIS — E1169 Type 2 diabetes mellitus with other specified complication: Secondary | ICD-10-CM | POA: Diagnosis not present

## 2016-03-06 DIAGNOSIS — Z23 Encounter for immunization: Secondary | ICD-10-CM | POA: Diagnosis not present

## 2016-03-06 DIAGNOSIS — Z0000 Encounter for general adult medical examination without abnormal findings: Secondary | ICD-10-CM | POA: Diagnosis not present

## 2016-03-06 LAB — CBC WITH DIFFERENTIAL/PLATELET
BASOS ABS: 0.1 10*3/uL (ref 0.0–0.1)
Basophils Relative: 1.1 % (ref 0.0–3.0)
EOS PCT: 3.3 % (ref 0.0–5.0)
Eosinophils Absolute: 0.2 10*3/uL (ref 0.0–0.7)
HCT: 37.1 % — ABNORMAL LOW (ref 39.0–52.0)
HEMOGLOBIN: 12.5 g/dL — AB (ref 13.0–17.0)
Lymphocytes Relative: 36.1 % (ref 12.0–46.0)
Lymphs Abs: 1.9 10*3/uL (ref 0.7–4.0)
MCHC: 33.6 g/dL (ref 30.0–36.0)
MCV: 82.3 fl (ref 78.0–100.0)
MONOS PCT: 8 % (ref 3.0–12.0)
Monocytes Absolute: 0.4 10*3/uL (ref 0.1–1.0)
NEUTROS PCT: 51.5 % (ref 43.0–77.0)
Neutro Abs: 2.7 10*3/uL (ref 1.4–7.7)
Platelets: 324 10*3/uL (ref 150.0–400.0)
RBC: 4.51 Mil/uL (ref 4.22–5.81)
RDW: 12.3 % (ref 11.5–15.5)
WBC: 5.3 10*3/uL (ref 4.0–10.5)

## 2016-03-06 LAB — BASIC METABOLIC PANEL
BUN: 24 mg/dL — AB (ref 6–23)
CALCIUM: 9.3 mg/dL (ref 8.4–10.5)
CHLORIDE: 98 meq/L (ref 96–112)
CO2: 35 mEq/L — ABNORMAL HIGH (ref 19–32)
CREATININE: 1.38 mg/dL (ref 0.40–1.50)
GFR: 68.2 mL/min (ref >60.00)
Glucose, Bld: 166 mg/dL — ABNORMAL HIGH (ref 70–99)
Potassium: 3.9 mEq/L (ref 3.5–5.1)
Sodium: 139 mEq/L (ref 135–145)

## 2016-03-06 LAB — TSH: TSH: 3.36 u[IU]/mL (ref 0.35–4.50)

## 2016-03-06 LAB — LIPID PANEL
Cholesterol: 138 mg/dL (ref 0–200)
HDL: 46.4 mg/dL (ref >39.00)
LDL Cholesterol: 73 mg/dL (ref 0–99)
NONHDL: 91.4
Total CHOL/HDL Ratio: 3
Triglycerides: 93 mg/dL (ref 0.0–149.0)
VLDL: 18.6 mg/dL (ref 0.0–40.0)

## 2016-03-06 LAB — HEMOGLOBIN A1C: Hgb A1c MFr Bld: 11.8 % — ABNORMAL HIGH (ref 4.6–6.5)

## 2016-03-06 LAB — ALT: ALT: 21 U/L (ref 0–53)

## 2016-03-06 LAB — AST: AST: 17 U/L (ref 0–37)

## 2016-03-06 NOTE — Assessment & Plan Note (Signed)
PLAN: DM: Chronic poor control, refer to [name], check the A1c in preparation for endocrinology visit.  HTN: Ambulatory BPs have been as high as 180s/90s. This morning BP was 198/90, at this office is 142/86. Patient suspect his cuff is not working properly. We just added amlodipine few days ago. Plan: Continue, amlodipine. Check ambulatory BPs, nurse visit in 3 weeks. High cholesterol: Labs Anxiety depression: Extensively counseled today. Recommend to strongly consider yoga, meditation, mindfulness, formal counseling and medication.States will think about his alternatives. Osteomyelitis: Currently under the care of Dr. Cannon Kettle and orthopedic surgery, ortho Rx a MRI, I don't have the results. States area looks better to him . Importance of keep working with his other doctors stressed. RTC 3  weeks for a nurse visit, 3 months for ROV

## 2016-03-06 NOTE — Assessment & Plan Note (Signed)
Td 2010; pnm 23 2016, prevnar today declined flu shot had a cscope 10-2011, 2 polyps, next 10-2016  2016--DRE neg,  PSA ok  Diet and  exercise -- discussed

## 2016-03-06 NOTE — Patient Instructions (Addendum)
GO TO THE LAB : Get the blood work     [place]  --Schedule your next appointment for a  routine visit with me in 3 months  --Schedule a nurse visit in 3 weeks for blood pressure checked    Check the  blood pressure  daily Be sure your blood pressure is between 110/65 and  145/85. If it is consistently higher or lower, let me know  Consider  yoga, meditation, mindfulness

## 2016-03-06 NOTE — Progress Notes (Signed)
Subjective:    Patient ID: Troy Becker, male    DOB: 09-Jan-1961, 56 y.o.   MRN: [id number]  DOS:  03/06/2016 Type of visit - description : cpx  Interval history: A number of other issues were discussed.   Review of Systems Constitutional: No fever. No chills. No unexplained wt changes. No unusual sweats  HEENT: No dental problems, no ear discharge, no facial swelling, no voice changes. No eye discharge, no eye  redness , no  intolerance to light   Respiratory: No wheezing , no  difficulty breathing. No cough , no mucus production  Cardiovascular: No CP, no leg swelling , no  Palpitations  GI: no nausea, no vomiting, no diarrhea , no  abdominal pain.  No blood in the stools. No dysphagia, no odynophagia    Endocrine: No polyphagia, no polyuria , no polydipsia  GU: No dysuria, gross hematuria, difficulty urinating. No urinary urgency, no frequency.  Musculoskeletal: Has osteomyelitis, follow-up by 2 other providers  Skin: No change in the color of the skin, palor , no  Rash  Allergic, immunologic: No environmental allergies , no  food allergies  Neurological: No dizziness no  syncope. No headaches. No diplopia, no slurred, no slurred speech, no motor deficits, no facial  Numbness  Hematological: No enlarged lymph nodes, no easy bruising , no unusual bleedings  Psychiatry: No suicidal ideas, no hallucinations, no beavior problems, no confusion.  Not working at this point, very frustrated about the issue, +Anxiety, some depression. No suicidal ideas  Past Medical History:  Diagnosis Date  . Abnormal EKG 2006   w/ a neg stress test  . Diabetes mellitus   . Hyperlipidemia   . Hypertension     Past Surgical History:  Procedure Laterality Date  . MULTIPLE TOOTH EXTRACTIONS      Social History   Social History  . Marital status: Married    Spouse name: N/A  . Number of children: 2  . Years of education: N/A   Occupational History  . out of work      Social  History Main Topics  . Smoking status: Never Smoker  . Smokeless tobacco: Never Used  . Alcohol use No  . Drug use: No  . Sexual activity: Not on file   Other Topics Concern  . Not on file   Social History Narrative   Household-- pt, wife, son           Family History  Problem Relation Age of Onset  . Coronary artery disease Father     CABG 64  . Hypertension Father   . Hypertension Mother   . Hypertension Sister   . Hypertension Brother   . Diabetes Brother     F M  . Colon cancer Neg Hx   . Prostate cancer Neg Hx      Allergies as of 03/06/2016   No Known Allergies     Medication List       Accurate as of 03/06/16  9:04 PM. Always use your most recent med list.          amLODipine 5 MG tablet Commonly known as:  NORVASC Take 1 tablet (5 mg total) by mouth daily.   aspirin 81 MG tablet Take 81 mg by mouth daily.   B-D ULTRAFINE III SHORT PEN 31G X 8 MM Misc Generic drug:  Insulin Pen Needle USE WITH PEN INJECTIONS   doxycycline 100 MG tablet Commonly known as:  VIBRA-TABS Take 1 tablet (100  mg total) by mouth 2 (two) times daily.   insulin aspart protamine - aspart (70-30) 100 UNIT/ML FlexPen Commonly known as:  NOVOLOG MIX 70/30 FLEXPEN Inject 60 units daily   losartan-hydrochlorothiazide 100-12.5 MG tablet Commonly known as:  HYZAAR Take 1 tablet by mouth daily.   ONE TOUCH ULTRA 2 w/Device Kit 1 Device by Does not apply route once.   ONETOUCH DELICA LANCETS Misc Use as directed to check blood sugar twice daily dx 250.02   sildenafil 100 MG tablet Commonly known as:  VIAGRA Take 0.5-1 tablets (50-100 mg total) by mouth daily as needed for erectile dysfunction.   simvastatin 40 MG tablet Commonly known as:  ZOCOR Take 1 tablet (40 mg total) by mouth at bedtime.   terbinafine 1 % cream Commonly known as:  LAMISIL Apply 1 application topically at bedtime. Reported on 03/03/2015          Objective:   Physical Exam BP (!) 142/86 (BP  Location: Left Arm, Patient Position: Sitting, Cuff Size: Normal)   Pulse 85   Temp 98.4 F (36.9 C) (Oral)   Resp 14   Ht '5\' 11"'  (1.803 m)   Wt 238 lb 6 oz (108.1 kg)   SpO2 93%   BMI 33.25 kg/m   General:   Well developed, well nourished . NAD.  Neck: No  thyromegaly  HEENT:  Normocephalic . Face symmetric, atraumatic Lungs:  CTA B Normal respiratory effort, no intercostal retractions, no accessory muscle use. Heart: RRR,  no murmur.  No pretibial edema bilaterally  Abdomen:  Not distended, soft, non-tender. No rebound or rigidity.   Skin: Exposed areas without rash. Not pale. Not jaundice Neurologic:  alert & oriented X3.  Speech normal, gait appropriate for age and unassisted Strength symmetric and appropriate for age.  Psych: Cognition and judgment appear intact.  Cooperative with normal attention span and concentration.  Behavior appropriate. Tearful at times during the visit.    Assessment & Plan:     Assessment DM, [name]  Mild neuropathy per foot exam 10-2015 HTN Hyperlipidemia Abnormal EKG, negative stress test 2006 FH CAD-- F CABG age 72   PLAN: DM: Chronic poor control, refer to [name], check the A1c in preparation for endocrinology visit.  HTN: Ambulatory BPs have been as high as 180s/90s. This morning BP was 198/90, at this office is 142/86. Patient suspect his cuff is not working properly. We just added amlodipine few days ago. Plan: Continue, amlodipine. Check ambulatory BPs, nurse visit in 3 weeks. High cholesterol: Labs Anxiety depression: Extensively counseled today. Recommend to strongly consider yoga, meditation, mindfulness, formal counseling and medication.States will think about his alternatives. Osteomyelitis: Currently under the care of Dr. Cannon Kettle and orthopedic surgery, ortho Rx a MRI, I don't have the results. States area looks better to him . Importance of keep working with his other doctors stressed. RTC 3  weeks for a nurse  visit, 3 months for ROV

## 2016-03-06 NOTE — Progress Notes (Signed)
Pre visit review using our clinic review tool, if applicable. No additional management support is needed unless otherwise documented below in the visit note. 

## 2016-03-08 ENCOUNTER — Encounter: Payer: Self-pay | Admitting: Sports Medicine

## 2016-03-08 ENCOUNTER — Ambulatory Visit (INDEPENDENT_AMBULATORY_CARE_PROVIDER_SITE_OTHER): Payer: BLUE CROSS/BLUE SHIELD | Admitting: Sports Medicine

## 2016-03-08 DIAGNOSIS — M86171 Other acute osteomyelitis, right ankle and foot: Secondary | ICD-10-CM

## 2016-03-08 DIAGNOSIS — Z01818 Encounter for other preprocedural examination: Secondary | ICD-10-CM

## 2016-03-08 DIAGNOSIS — M79674 Pain in right toe(s): Secondary | ICD-10-CM

## 2016-03-08 DIAGNOSIS — E119 Type 2 diabetes mellitus without complications: Secondary | ICD-10-CM

## 2016-03-08 DIAGNOSIS — Z794 Long term (current) use of insulin: Secondary | ICD-10-CM

## 2016-03-08 DIAGNOSIS — M19079 Primary osteoarthritis, unspecified ankle and foot: Secondary | ICD-10-CM

## 2016-03-08 DIAGNOSIS — L97514 Non-pressure chronic ulcer of other part of right foot with necrosis of bone: Secondary | ICD-10-CM

## 2016-03-08 NOTE — Patient Instructions (Signed)
Pre-Operative Instructions  Congratulations, you have decided to take an important step to improving your quality of life.  You can be assured that the doctors of [facility] will be with you every step of the way.  1. Plan to be at the surgery center/hospital at least 1 (one) hour prior to your scheduled time unless otherwise directed by the surgical center/hospital staff.  You must have a responsible adult accompany you, remain during the surgery and drive you home.  Make sure you have directions to the surgical center/hospital and know how to get there on time. 2. For hospital based surgery you will need to obtain a history and physical form from your family physician within 1 month prior to the date of surgery- we will give you a form for you primary physician.  3. We make every effort to accommodate the date you request for surgery.  There are however, times where surgery dates or times have to be moved.  We will contact you as soon as possible if a change in schedule is required.   4. No Aspirin/Ibuprofen for one week before surgery.  If you are on aspirin, any non-steroidal anti-inflammatory medications (Mobic, Aleve, Ibuprofen) you should stop taking it 7 days prior to your surgery.  You make take Tylenol  For pain prior to surgery.  5. Medications- If you are taking daily heart and blood pressure medications, seizure, reflux, allergy, asthma, anxiety, pain or diabetes medications, make sure the surgery center/hospital is aware before the day of surgery so they may notify you which medications to take or avoid the day of surgery. 6. No food or drink after midnight the night before surgery unless directed otherwise by surgical center/hospital staff. 7. No alcoholic beverages 24 hours prior to surgery.  No smoking 24 hours prior to or 24 hours after surgery. 8. Wear loose pants or shorts- loose enough to fit over bandages, boots, and casts. 9. No slip on shoes, sneakers are best. 10. Bring  your boot with you to the surgery center/hospital.  Also bring crutches or a walker if your physician has prescribed it for you.  If you do not have this equipment, it will be provided for you after surgery. 11. If you have not been contracted by the surgery center/hospital by the day before your surgery, call to confirm the date and time of your surgery. 12. Leave-time from work may vary depending on the type of surgery you have.  Appropriate arrangements should be made prior to surgery with your employer. 13. Prescriptions will be provided immediately following surgery by your doctor.  Have these filled as soon as possible after surgery and take the medication as directed. 14. Remove nail polish on the operative foot. 15. Wash the night before surgery.  The night before surgery wash the foot and leg well with the antibacterial soap provided and water paying special attention to beneath the toenails and in between the toes.  Rinse thoroughly with water and dry well with a towel.  Perform this wash unless told not to do so by your physician.  Enclosed: 1 Ice pack (please put in freezer the night before surgery)   1 Hibiclens skin cleaner   Pre-op Instructions  If you have any questions regarding the instructions, do not hesitate to call our office.  [place]: [address] [place], [place] [address] [phone]  [place]: [address] [place], [place] [address] [phone]  [place]: [address]  [place], [place] [address] [phone]   Dr.   Norman Regal DPM, Dr. Matthew Wagoner DPM, Dr. M. Todd Hyatt DPM, Dr. Asir Bingley DPM 

## 2016-03-08 NOTE — Progress Notes (Signed)
Subjective: Troy Becker is a 56 y.o. male patient seen in office for follow up evaluation of ulceration of the right hallux. Patient has a history of diabetes and a blood glucose level today not recorded.  Patient is changing the dressing using Iodosorb. States that he is taking his antibiotics and has MRI report and CD for me to discuss with him. Denies nausea/fever/vomiting/chills/night sweats/shortness of breath/pain. Patient  has no other pedal complaints at this time.  Patient Active Problem List   Diagnosis Date Noted  . PCP NOTES >>>>>>>>>>>>>>>>>> 10/23/2015  . Onychomycosis ? 06/03/2013  . Personal history of adenomatous colonic polyps 10/10/2011  . Annual physical exam 08/07/2011  . Encounter for long-term (current) use of other medications 08/05/2011  . Anxiety 07/21/2009  . ERECTILE DYSFUNCTION 11/05/2007  . Diabetes ([place]) 10/23/2006  . HYPERLIPIDEMIA 10/23/2006  . ELECTROCARDIOGRAM, ABNORMAL 10/23/2006  . HYPERTENSION 07/23/2006   Current Outpatient Prescriptions on File Prior to Visit  Medication Sig Dispense Refill  . amLODipine (NORVASC) 5 MG tablet Take 1 tablet (5 mg total) by mouth daily. 30 tablet 3  . aspirin 81 MG tablet Take 81 mg by mouth daily.      . B-D ULTRAFINE III SHORT PEN 31G X 8 MM MISC USE WITH PEN INJECTIONS (Patient not taking: Reported on 03/06/2016) 100 each 0  . Blood Glucose Monitoring Suppl (ONE TOUCH ULTRA 2) W/DEVICE KIT 1 Device by Does not apply route once. (Patient not taking: Reported on 03/06/2016) 1 each 0  . doxycycline (VIBRA-TABS) 100 MG tablet Take 1 tablet (100 mg total) by mouth 2 (two) times daily. 28 tablet 0  . insulin aspart protamine - aspart (NOVOLOG MIX 70/30 FLEXPEN) (70-30) 100 UNIT/ML FlexPen Inject 60 units daily 30 mL 2  . losartan-hydrochlorothiazide (HYZAAR) 100-12.5 MG tablet Take 1 tablet by mouth daily. 30 tablet 5  . ONETOUCH DELICA LANCETS MISC Use as directed to check blood sugar twice daily dx 250.02 (Patient  not taking: Reported on 03/06/2016) 100 each 5  . sildenafil (VIAGRA) 100 MG tablet Take 0.5-1 tablets (50-100 mg total) by mouth daily as needed for erectile dysfunction. 3 tablet 3  . simvastatin (ZOCOR) 40 MG tablet Take 1 tablet (40 mg total) by mouth at bedtime. 30 tablet 5  . terbinafine (LAMISIL) 1 % cream Apply 1 application topically at bedtime. Reported on 03/03/2015     No current facility-administered medications on file prior to visit.    No Known Allergies  Recent Results (from the past 2160 hour(s))  Basic metabolic panel     Status: Abnormal   Collection Time: 03/06/16  9:56 AM  Result Value Ref Range   Sodium 139 135 - 145 mEq/L   Potassium 3.9 3.5 - 5.1 mEq/L   Chloride 98 96 - 112 mEq/L   CO2 35 (H) 19 - 32 mEq/L   Glucose, Bld 166 (H) 70 - 99 mg/dL   BUN 24 (H) 6 - 23 mg/dL   Creatinine, Ser 1.38 0.40 - 1.50 mg/dL   Calcium 9.3 8.4 - 10.5 mg/dL   GFR 68.20 >60.00 mL/min  AST     Status: None   Collection Time: 03/06/16  9:56 AM  Result Value Ref Range   AST 17 0 - 37 U/L  ALT     Status: None   Collection Time: 03/06/16  9:56 AM  Result Value Ref Range   ALT 21 0 - 53 U/L  Lipid panel     Status: None   Collection Time:  03/06/16  9:56 AM  Result Value Ref Range   Cholesterol 138 0 - 200 mg/dL    Comment: ATP III Classification       Desirable:  < 200 mg/dL               Borderline High:  200 - 239 mg/dL          High:  > = 240 mg/dL   Triglycerides 93.0 0.0 - 149.0 mg/dL    Comment: Normal:  <150 mg/dLBorderline High:  150 - 199 mg/dL   HDL 46.40 >39.00 mg/dL   VLDL 18.6 0.0 - 40.0 mg/dL   LDL Cholesterol 73 0 - 99 mg/dL   Total CHOL/HDL Ratio 3     Comment:                Men          Women1/2 Average Risk     3.4          3.3Average Risk          5.0          4.42X Average Risk          9.6          7.13X Average Risk          15.0          11.0                       NonHDL 91.40     Comment: NOTE:  Non-HDL goal should be 30 mg/dL higher than patient's  LDL goal (i.e. LDL goal of < 70 mg/dL, would have non-HDL goal of < 100 mg/dL)  CBC w/Diff     Status: Abnormal   Collection Time: 03/06/16  9:56 AM  Result Value Ref Range   WBC 5.3 4.0 - 10.5 K/uL   RBC 4.51 4.22 - 5.81 Mil/uL   Hemoglobin 12.5 (L) 13.0 - 17.0 g/dL   HCT 37.1 (L) 39.0 - 52.0 %   MCV 82.3 78.0 - 100.0 fl   MCHC 33.6 30.0 - 36.0 g/dL   RDW 12.3 11.5 - 15.5 %   Platelets 324.0 150.0 - 400.0 K/uL   Neutrophils Relative % 51.5 43.0 - 77.0 %   Lymphocytes Relative 36.1 12.0 - 46.0 %   Monocytes Relative 8.0 3.0 - 12.0 %   Eosinophils Relative 3.3 0.0 - 5.0 %   Basophils Relative 1.1 0.0 - 3.0 %   Neutro Abs 2.7 1.4 - 7.7 K/uL   Lymphs Abs 1.9 0.7 - 4.0 K/uL   Monocytes Absolute 0.4 0.1 - 1.0 K/uL   Eosinophils Absolute 0.2 0.0 - 0.7 K/uL   Basophils Absolute 0.1 0.0 - 0.1 K/uL  Hemoglobin A1c     Status: Abnormal   Collection Time: 03/06/16  9:56 AM  Result Value Ref Range   Hgb A1c MFr Bld 11.8 (H) 4.6 - 6.5 %    Comment: Glycemic Control Guidelines for People with Diabetes:Non Diabetic:  <6%Goal of Therapy: <7%Additional Action Suggested:  >8%   TSH     Status: None   Collection Time: 03/06/16  9:56 AM  Result Value Ref Range   TSH 3.36 0.35 - 4.50 uIU/mL    Objective: There were no vitals filed for this visit.  General: Patient is awake, alert, oriented x 3 and in no acute distress. Appear to be in complete denial about foot ulceration; he continues with non-compliance but did bring his  wife to today's visit.   Dermatology: Skin is warm and dry. Ulceration is healed at right hallux dorsal IPJ. However, still open at right hallux plantar aspect distal tuft that measures 0.6x0.5x 0.4 cm close to bone (last measurement much smaller) with a fibrotic base and periwound keratosis.There is no malodor, no active drainage, decreased erythema, mild edema. No other acute signs of infection. Nails are short, thickened, dystrophic resembling onychomycosis.   Vascular:  Dorsalis Pedis pulse = 2/4 Bilateral,  Posterior Tibial pulse = 1/4 Bilateral,  Capillary Fill Time < 5 seconds  Neurologic: Protective sensation intact using  the 5.07/10g Semmes Weinstein Monofilament.  Musculosketal:No Pain with palpation to ulcerated area right hallux. No pain with compression to calves bilateral. 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  MRI + Distal phalanx osteomyelitis with sinus tract from ulcer bed  Assessment and Plan:  Problem List Items Addressed This Visit    None    Visit Diagnoses    Chronic ulcer of right great toe, with necrosis of bone ([place])    -  Primary   Other acute osteomyelitis of right foot ([place])       Pain in toe of right foot       Arthritis of big toe       Type 2 diabetes mellitus without complication, with long-term current use of insulin ([place])       Pre-op examination         -Examined patient and discussed the progression of the wound and treatment alternatives for osteomyelitis. -MRI results reviewed   -Debrided all nonviable tissue and loose nail. Cleansed ulceration and applied iodosorb and dry sterile dressing and instructed patient to continue with daily dressings at home consisting of the same or betadine if he runs out of Iodosorb. May change twice daily if there is drainage and use wound cleanser. -Recommend patient to continue with postoperative shoe at all times -Continue with Doxycycline  - Advised patient to go to the ER or return to office if the wound worsens or if constitutional symptoms are present. -Patient opt for surgical management. Consent obtained for Right hallux amputation with removal of all infected bone and soft tissue. Pre and Post op course explained. Risks, benefits, alternatives explained. No guarantees given or implied. Surgical booking slip submitted and provided patient with Surgical packet and info for [facility] with H&P Form.   -Patient to continue with Post op shoe he has to use after surgery -Patient to return to office after surgery for follow up care and evaluation or sooner if problems arise.  Landis Martins, DPM

## 2016-03-11 ENCOUNTER — Telehealth: Payer: Self-pay | Admitting: Internal Medicine

## 2016-03-11 NOTE — Telephone Encounter (Signed)
Pt dropped off documents from [facility] for dr. Larose Kells to fill out, documents were placed in tray at front office

## 2016-03-13 ENCOUNTER — Telehealth: Payer: Self-pay | Admitting: *Deleted

## 2016-03-13 NOTE — Telephone Encounter (Signed)
"  I can be reached at [phone].  I'm a patient of Dr. Cannon Kettle, thank you."

## 2016-03-14 ENCOUNTER — Other Ambulatory Visit: Payer: Self-pay | Admitting: Sports Medicine

## 2016-03-14 ENCOUNTER — Telehealth: Payer: Self-pay | Admitting: *Deleted

## 2016-03-14 DIAGNOSIS — L97514 Non-pressure chronic ulcer of other part of right foot with necrosis of bone: Secondary | ICD-10-CM

## 2016-03-14 DIAGNOSIS — M79674 Pain in right toe(s): Secondary | ICD-10-CM

## 2016-03-14 DIAGNOSIS — M86171 Other acute osteomyelitis, right ankle and foot: Secondary | ICD-10-CM

## 2016-03-14 MED ORDER — DOXYCYCLINE HYCLATE 100 MG PO TABS: 100.0000 mg | ORAL_TABLET | Freq: Two times a day (BID) | ORAL | 1 refills | Status: DC

## 2016-03-14 NOTE — Telephone Encounter (Signed)
"  I'm returning your call again.  I'll try calling you back later."

## 2016-03-14 NOTE — Telephone Encounter (Signed)
Form received. Paperwork process initiated.   

## 2016-03-14 NOTE — Addendum Note (Signed)
Addended by: Harriett Sine D on: 03/14/2016 04:06 PM   Modules accepted: Orders

## 2016-03-14 NOTE — Telephone Encounter (Signed)
I attempted to return patient's call.

## 2016-03-14 NOTE — Telephone Encounter (Addendum)
Pt is requesting refill of Doxycycline. Informed pt the refill would be at the [facility] on the corner of [address].

## 2016-03-14 NOTE — Telephone Encounter (Addendum)
-----   Message from [place] sent at 03/14/2016  2:09 PM EST ----- Contact: [phone] Patient ran out of antibiotic; is he to continue this until surgery.  Call in to pharmacy on file. Informed pt the doxycycline would be called to the [facility].

## 2016-03-21 NOTE — Telephone Encounter (Signed)
"  I need to schedule a surgical procedure with Dr. Cannon Kettle.  I will call you later today if I do not hear from you."    I'm returning your call.  You want to schedule surgery?  "Yes, I would."  When would you like to do it?  "I know Monday January 22 is out of the question.  I start a new job on January 29 so I can't do it then.  So, I guess it will be February 5." That date is available.  "Where will it be done?"  It will be done at [facility].  Have you seen your primary care physician within the past 30 days?  "Yes, I have.  As a matter of fact I took the form to them and they have not called me back to let me know it's ready.  I will go by there and get it.  Where do I send it to?"  You can just take it by our office and we will take care of getting it to the surgical center.

## 2016-03-25 ENCOUNTER — Encounter: Payer: Self-pay | Admitting: Endocrinology

## 2016-03-26 ENCOUNTER — Telehealth: Payer: Self-pay | Admitting: *Deleted

## 2016-03-26 NOTE — Telephone Encounter (Signed)
"  I'm scheduled for surgery on February 5.  I just spoke to someone from [facility]  Is there anywhere cheaper she can do my surgery?"  No, they could actually cost more.  "How about if I get it done at [facility]?"  It will probably be the same, they are actually under the [facility] umbrella.  "Okay, so what all will I be paying?"  You will be paying for a physician fee, facility fee and anesthesia fee.  "Is there anyway you can tell me how much I will have to pay out of pocket?"  I can get Jocelyn Lamer to give you a call with an estimate.  She is not in the office today.  She can only give you the physician fee, you will need to call [facility] for anesthesia fee and the facility fee.  "Okay!"

## 2016-04-02 ENCOUNTER — Encounter (HOSPITAL_BASED_OUTPATIENT_CLINIC_OR_DEPARTMENT_OTHER): Payer: Self-pay | Admitting: *Deleted

## 2016-04-02 ENCOUNTER — Encounter: Payer: Self-pay | Admitting: Sports Medicine

## 2016-04-03 ENCOUNTER — Ambulatory Visit (INDEPENDENT_AMBULATORY_CARE_PROVIDER_SITE_OTHER): Payer: BLUE CROSS/BLUE SHIELD | Admitting: Internal Medicine

## 2016-04-03 VITALS — BP 196/94 | HR 81

## 2016-04-03 DIAGNOSIS — I10 Essential (primary) hypertension: Secondary | ICD-10-CM

## 2016-04-03 MED ORDER — LOSARTAN POTASSIUM-HCTZ 100-25 MG PO TABS: 1.0000 | ORAL_TABLET | Freq: Every day | ORAL | 3 refills | Status: DC

## 2016-04-03 MED ORDER — AMLODIPINE BESYLATE 10 MG PO TABS
10.0000 mg | ORAL_TABLET | Freq: Every day | ORAL | 3 refills | Status: DC
Start: 2016-04-03 — End: 2017-02-20

## 2016-04-03 NOTE — Progress Notes (Signed)
Pre visit review using our clinic tool,if applicable. No additional management support is needed unless otherwise documented below in the visit note.   Patient in for BP check per order from Dr. Larose Kells. Patient states he has not had BP meds this am.   BP=184/80 P=78  Per Dr. Larose Kells patient to increase Amlodipine  To 10 mg and Hyzaar to 100-25mg . Check blood pressures at home and record. Return Friday for BP check and BMP. Patient agreed.  Kathlene November, MD

## 2016-04-05 ENCOUNTER — Telehealth: Payer: Self-pay | Admitting: *Deleted

## 2016-04-05 ENCOUNTER — Ambulatory Visit (INDEPENDENT_AMBULATORY_CARE_PROVIDER_SITE_OTHER): Payer: BLUE CROSS/BLUE SHIELD | Admitting: Internal Medicine

## 2016-04-05 ENCOUNTER — Encounter (HOSPITAL_BASED_OUTPATIENT_CLINIC_OR_DEPARTMENT_OTHER)
Admission: RE | Admit: 2016-04-05 | Discharge: 2016-04-05 | Disposition: A | Discharge status: Home / Self Care | Payer: BLUE CROSS/BLUE SHIELD | Source: Ambulatory Visit | Attending: Sports Medicine | Admitting: Sports Medicine

## 2016-04-05 VITALS — BP 185/76 | HR 80

## 2016-04-05 DIAGNOSIS — I4510 Unspecified right bundle-branch block: Secondary | ICD-10-CM | POA: Diagnosis not present

## 2016-04-05 DIAGNOSIS — Z79899 Other long term (current) drug therapy: Secondary | ICD-10-CM | POA: Diagnosis not present

## 2016-04-05 DIAGNOSIS — Z86010 Personal history of colonic polyps: Secondary | ICD-10-CM | POA: Diagnosis not present

## 2016-04-05 DIAGNOSIS — Z794 Long term (current) use of insulin: Secondary | ICD-10-CM | POA: Diagnosis not present

## 2016-04-05 DIAGNOSIS — E11621 Type 2 diabetes mellitus with foot ulcer: Secondary | ICD-10-CM | POA: Diagnosis not present

## 2016-04-05 DIAGNOSIS — I10 Essential (primary) hypertension: Secondary | ICD-10-CM | POA: Diagnosis not present

## 2016-04-05 DIAGNOSIS — L97519 Non-pressure chronic ulcer of other part of right foot with unspecified severity: Secondary | ICD-10-CM | POA: Diagnosis not present

## 2016-04-05 DIAGNOSIS — Z7982 Long term (current) use of aspirin: Secondary | ICD-10-CM | POA: Diagnosis not present

## 2016-04-05 DIAGNOSIS — Z833 Family history of diabetes mellitus: Secondary | ICD-10-CM | POA: Diagnosis not present

## 2016-04-05 DIAGNOSIS — N529 Male erectile dysfunction, unspecified: Secondary | ICD-10-CM | POA: Diagnosis not present

## 2016-04-05 DIAGNOSIS — E1169 Type 2 diabetes mellitus with other specified complication: Secondary | ICD-10-CM | POA: Diagnosis present

## 2016-04-05 DIAGNOSIS — Z9889 Other specified postprocedural states: Secondary | ICD-10-CM | POA: Diagnosis not present

## 2016-04-05 DIAGNOSIS — Z8249 Family history of ischemic heart disease and other diseases of the circulatory system: Secondary | ICD-10-CM | POA: Diagnosis not present

## 2016-04-05 DIAGNOSIS — E785 Hyperlipidemia, unspecified: Secondary | ICD-10-CM | POA: Diagnosis not present

## 2016-04-05 DIAGNOSIS — M868X7 Other osteomyelitis, ankle and foot: Secondary | ICD-10-CM | POA: Diagnosis not present

## 2016-04-05 LAB — BASIC METABOLIC PANEL
ANION GAP: 7 (ref 5–15)
BUN: 23 mg/dL — ABNORMAL HIGH (ref 6–20)
CALCIUM: 9.3 mg/dL (ref 8.9–10.3)
CHLORIDE: 99 mmol/L — AB (ref 101–111)
CO2: 30 mmol/L (ref 22–32)
CREATININE: 1.62 mg/dL — AB (ref 0.61–1.24)
GFR calc non Af Amer: 46 mL/min — ABNORMAL LOW (ref >60)
GFR, EST AFRICAN AMERICAN: 53 mL/min — AB (ref >60)
Glucose, Bld: 290 mg/dL — ABNORMAL HIGH (ref 65–99)
Potassium: 3.8 mmol/L (ref 3.5–5.1)
SODIUM: 136 mmol/L (ref 135–145)

## 2016-04-05 MED ORDER — CARVEDILOL 6.25 MG PO TABS: 6.2500 mg | ORAL_TABLET | Freq: Two times a day (BID) | ORAL | 1 refills | Status: DC

## 2016-04-05 NOTE — Telephone Encounter (Signed)
"  Mr. Troy Becker is scheduled for surgery on 04/08/2016.  His history and physical form will be expired.  The form was done on 03/06/2016.  He is going to have to have this updated.  It has to be within 30 days."  I called the patient and informed him that his history and physical form will be expired by the time he has surgery.  I told him he will need to have another history and physical form filled out by his primary care doctor.  He said he is going by Dr. Ethel Rana office today.  I told him I would fax the forms to Dr. Ethel Rana office.  I called Dr. Ethel Rana office and spoke to [place].  I'm going to fax the H&P form to Dr. Larose Kells at [phone].

## 2016-04-05 NOTE — Telephone Encounter (Signed)
Forwarding to [employer]. I believe this is form that PCP said he would not complete for history & physical, would need to be completed by Ortho.

## 2016-04-05 NOTE — Progress Notes (Signed)
Ekg reviewed by Dr. Lissa Hoard - ok for surgery.

## 2016-04-05 NOTE — Telephone Encounter (Signed)
[facility] called In. They will be faxing over form for completion for pt's surgery that is scheduled for Monday 04/08/16.

## 2016-04-05 NOTE — Telephone Encounter (Signed)
"  I'm over at Dr. Ethel Rana office.  Maybe you can explain to them what I need better than I can."  You need your history and physical form filled out again.  Your previous one expired.  It has to have been completed within 30 days.  "Okay, thank you so much for your help."

## 2016-04-05 NOTE — Progress Notes (Signed)
Here for BP check, yesterday at home was 148/76. BP here 197/86. He looks very anxious. Denies chest pain or difficulty breathing.  We could wait a few more days to adjust his medication if needed but he is having surgery next week thus will add a medicine Plan:  Continue present care, add carvedilol 6. 25 mg twice a day for 30, 1 refill. Check BPs at home daily, call with readings next week. JP

## 2016-04-05 NOTE — Patient Instructions (Addendum)
Per Dr. Larose Kells: START Carvedilol 6.25 MG twice daily. Continue current regimen with all other medications. Check BP daily at home and call on Monday, 04/08/16 with the readings.

## 2016-04-05 NOTE — Progress Notes (Signed)
Pre visit review using our clinic review tool, if applicable. No additional management support is needed unless otherwise documented below in the visit note.  Patient came in office today for blood pressure check per OV note 04/03/16. Reviewed medications & regimen with the patient. Readings were as follow: BP 197/86 P 82 & BP 185/76 P 80.  Per Dr. Larose Kells: START Carvedilol 6.25 MG twice daily. Continue current regimen with all other medications. Check BP daily at home and call on Monday, 04/08/16 with the readings.  Patient was made aware of the provider's instructions. He verbalized understanding and did not have any further concerns before leaving the nurse visit.  Kathlene November, MD

## 2016-04-08 ENCOUNTER — Encounter (HOSPITAL_BASED_OUTPATIENT_CLINIC_OR_DEPARTMENT_OTHER)
Admission: RE | Disposition: A | Discharge status: Home / Self Care | Payer: Self-pay | Source: Ambulatory Visit | Attending: Sports Medicine

## 2016-04-08 ENCOUNTER — Ambulatory Visit (HOSPITAL_BASED_OUTPATIENT_CLINIC_OR_DEPARTMENT_OTHER): Payer: BLUE CROSS/BLUE SHIELD | Admitting: Anesthesiology

## 2016-04-08 ENCOUNTER — Ambulatory Visit (HOSPITAL_BASED_OUTPATIENT_CLINIC_OR_DEPARTMENT_OTHER)
Admission: RE | Admit: 2016-04-08 | Discharge: 2016-04-08 | Disposition: A | Discharge status: Home / Self Care | Payer: BLUE CROSS/BLUE SHIELD | Source: Ambulatory Visit | Attending: Sports Medicine | Admitting: Sports Medicine

## 2016-04-08 ENCOUNTER — Encounter (HOSPITAL_BASED_OUTPATIENT_CLINIC_OR_DEPARTMENT_OTHER): Payer: Self-pay | Admitting: Anesthesiology

## 2016-04-08 ENCOUNTER — Encounter: Payer: Self-pay | Admitting: Sports Medicine

## 2016-04-08 DIAGNOSIS — Z79899 Other long term (current) drug therapy: Secondary | ICD-10-CM | POA: Insufficient documentation

## 2016-04-08 DIAGNOSIS — M86679 Other chronic osteomyelitis, unspecified ankle and foot: Secondary | ICD-10-CM | POA: Diagnosis not present

## 2016-04-08 DIAGNOSIS — E1169 Type 2 diabetes mellitus with other specified complication: Secondary | ICD-10-CM | POA: Insufficient documentation

## 2016-04-08 DIAGNOSIS — Z86010 Personal history of colonic polyps: Secondary | ICD-10-CM | POA: Insufficient documentation

## 2016-04-08 DIAGNOSIS — Z8249 Family history of ischemic heart disease and other diseases of the circulatory system: Secondary | ICD-10-CM | POA: Insufficient documentation

## 2016-04-08 DIAGNOSIS — N529 Male erectile dysfunction, unspecified: Secondary | ICD-10-CM | POA: Insufficient documentation

## 2016-04-08 DIAGNOSIS — E11621 Type 2 diabetes mellitus with foot ulcer: Secondary | ICD-10-CM

## 2016-04-08 DIAGNOSIS — Z9889 Other specified postprocedural states: Secondary | ICD-10-CM | POA: Insufficient documentation

## 2016-04-08 DIAGNOSIS — Z833 Family history of diabetes mellitus: Secondary | ICD-10-CM | POA: Insufficient documentation

## 2016-04-08 DIAGNOSIS — M868X7 Other osteomyelitis, ankle and foot: Secondary | ICD-10-CM | POA: Insufficient documentation

## 2016-04-08 DIAGNOSIS — E785 Hyperlipidemia, unspecified: Secondary | ICD-10-CM | POA: Insufficient documentation

## 2016-04-08 DIAGNOSIS — L97519 Non-pressure chronic ulcer of other part of right foot with unspecified severity: Secondary | ICD-10-CM | POA: Insufficient documentation

## 2016-04-08 DIAGNOSIS — Z7982 Long term (current) use of aspirin: Secondary | ICD-10-CM | POA: Insufficient documentation

## 2016-04-08 DIAGNOSIS — I4510 Unspecified right bundle-branch block: Secondary | ICD-10-CM | POA: Insufficient documentation

## 2016-04-08 DIAGNOSIS — Z794 Long term (current) use of insulin: Secondary | ICD-10-CM | POA: Insufficient documentation

## 2016-04-08 HISTORY — DX: Osteomyelitis, unspecified: M86.9

## 2016-04-08 HISTORY — PX: AMPUTATION TOE: SHX6595

## 2016-04-08 LAB — GLUCOSE, CAPILLARY
GLUCOSE-CAPILLARY: 224 mg/dL — AB (ref 65–99)
GLUCOSE-CAPILLARY: 239 mg/dL — AB (ref 65–99)

## 2016-04-08 SURGERY — AMPUTATION, TOE
Anesthesia: Monitor Anesthesia Care | Site: Toe | Laterality: Right

## 2016-04-08 MED ORDER — CHLORHEXIDINE GLUCONATE CLOTH 2 % EX PADS: 6.0000 | MEDICATED_PAD | Freq: Once | CUTANEOUS | Status: DC

## 2016-04-08 MED ORDER — PROMETHAZINE HCL 25 MG/ML IJ SOLN: 6.2500 mg | INTRAMUSCULAR | Status: DC | PRN

## 2016-04-08 MED ORDER — CEFAZOLIN SODIUM-DEXTROSE 2-4 GM/100ML-% IV SOLN
INTRAVENOUS | Status: AC
  Filled 2016-04-08: qty 100

## 2016-04-08 MED ORDER — FENTANYL CITRATE (PF) 100 MCG/2ML IJ SOLN: 25.0000 ug | INTRAMUSCULAR | Status: DC | PRN

## 2016-04-08 MED ORDER — FENTANYL CITRATE (PF) 100 MCG/2ML IJ SOLN
50.0000 ug | INTRAMUSCULAR | Status: DC | PRN
  Administered 2016-04-08: 50 ug via INTRAVENOUS

## 2016-04-08 MED ORDER — BUPIVACAINE HCL (PF) 0.5 % IJ SOLN
INTRAMUSCULAR | Status: AC
Start: 2016-04-08 — End: 2016-04-08
  Filled 2016-04-08: qty 60

## 2016-04-08 MED ORDER — MEPERIDINE HCL 25 MG/ML IJ SOLN: 6.2500 mg | INTRAMUSCULAR | Status: DC | PRN

## 2016-04-08 MED ORDER — LIDOCAINE 2% (20 MG/ML) 5 ML SYRINGE
INTRAMUSCULAR | Status: AC
  Filled 2016-04-08: qty 5

## 2016-04-08 MED ORDER — LIDOCAINE HCL (PF) 1 % IJ SOLN
INTRAMUSCULAR | Status: AC
  Filled 2016-04-08: qty 60

## 2016-04-08 MED ORDER — HYDROCODONE-ACETAMINOPHEN 7.5-325 MG PO TABS: 1.0000 | ORAL_TABLET | Freq: Four times a day (QID) | ORAL | 0 refills | Status: DC | PRN

## 2016-04-08 MED ORDER — FENTANYL CITRATE (PF) 100 MCG/2ML IJ SOLN
INTRAMUSCULAR | Status: AC
  Filled 2016-04-08: qty 2

## 2016-04-08 MED ORDER — LACTATED RINGERS IV SOLN: INTRAVENOUS | Status: DC

## 2016-04-08 MED ORDER — MIDAZOLAM HCL 2 MG/2ML IJ SOLN
1.0000 mg | INTRAMUSCULAR | Status: DC | PRN
  Administered 2016-04-08: 2 mg via INTRAVENOUS

## 2016-04-08 MED ORDER — DOCUSATE SODIUM 100 MG PO CAPS: 100.0000 mg | ORAL_CAPSULE | Freq: Two times a day (BID) | ORAL | 0 refills | Status: DC

## 2016-04-08 MED ORDER — LIDOCAINE HCL 1 % IJ SOLN
INTRAMUSCULAR | Status: DC | PRN
  Administered 2016-04-08: 10 mL

## 2016-04-08 MED ORDER — OXYCODONE HCL 5 MG/5ML PO SOLN: 5.0000 mg | Freq: Once | ORAL | Status: DC | PRN

## 2016-04-08 MED ORDER — PROPOFOL 500 MG/50ML IV EMUL
INTRAVENOUS | Status: DC | PRN
  Administered 2016-04-08: 75 ug/kg/min via INTRAVENOUS

## 2016-04-08 MED ORDER — ONDANSETRON HCL 4 MG/2ML IJ SOLN
INTRAMUSCULAR | Status: DC | PRN
  Administered 2016-04-08: 4 mg via INTRAVENOUS

## 2016-04-08 MED ORDER — CEFAZOLIN SODIUM-DEXTROSE 2-4 GM/100ML-% IV SOLN
2.0000 g | INTRAVENOUS | Status: AC
  Administered 2016-04-08: 2 g via INTRAVENOUS

## 2016-04-08 MED ORDER — OXYCODONE HCL 5 MG PO TABS: 5.0000 mg | ORAL_TABLET | Freq: Once | ORAL | Status: DC | PRN

## 2016-04-08 MED ORDER — PROPOFOL 10 MG/ML IV BOLUS
INTRAVENOUS | Status: DC | PRN
  Administered 2016-04-08: 50 mg via INTRAVENOUS

## 2016-04-08 MED ORDER — PROMETHAZINE HCL 12.5 MG PO TABS: 12.5000 mg | ORAL_TABLET | Freq: Four times a day (QID) | ORAL | 0 refills | Status: DC | PRN

## 2016-04-08 MED ORDER — MIDAZOLAM HCL 2 MG/2ML IJ SOLN
INTRAMUSCULAR | Status: AC
  Filled 2016-04-08: qty 2

## 2016-04-08 MED ORDER — SCOPOLAMINE 1 MG/3DAYS TD PT72: 1.0000 | MEDICATED_PATCH | Freq: Once | TRANSDERMAL | Status: DC | PRN

## 2016-04-08 MED ORDER — LACTATED RINGERS IV SOLN
INTRAVENOUS | Status: DC
  Administered 2016-04-08: 13:00:00 via INTRAVENOUS
  Administered 2016-04-08: 10 mL/h via INTRAVENOUS

## 2016-04-08 SURGICAL SUPPLY — 53 items
BANDAGE ACE 3X5.8 VEL STRL LF (GAUZE/BANDAGES/DRESSINGS) ×2 IMPLANT
BLADE AVERAGE 25X9 (BLADE) ×4 IMPLANT
BLADE SURG 15 STRL LF DISP TIS (BLADE) ×2 IMPLANT
BLADE SURG 15 STRL SS (BLADE) ×2
BNDG COHESIVE 3X5 TAN STRL LF (GAUZE/BANDAGES/DRESSINGS) ×2 IMPLANT
BNDG CONFORM 2 STRL LF (GAUZE/BANDAGES/DRESSINGS) ×2 IMPLANT
BNDG ESMARK 4X9 LF (GAUZE/BANDAGES/DRESSINGS) ×2 IMPLANT
BNDG GAUZE ELAST 4 BULKY (GAUZE/BANDAGES/DRESSINGS) ×2 IMPLANT
BUR EGG 3PK/BX (BURR) IMPLANT
COVER BACK TABLE 60X90IN (DRAPES) ×2 IMPLANT
CUFF TOURNIQUET SINGLE 18IN (TOURNIQUET CUFF) IMPLANT
DRAPE EXTREMITY T 121X128X90 (DRAPE) ×2 IMPLANT
DRAPE IMP U-DRAPE 54X76 (DRAPES) ×2 IMPLANT
DRAPE OEC MINIVIEW 54X84 (DRAPES) ×2 IMPLANT
DRAPE SURG 17X23 STRL (DRAPES) ×2 IMPLANT
DRSG EMULSION OIL 3X3 NADH (GAUZE/BANDAGES/DRESSINGS) ×2 IMPLANT
DRSG PAD ABDOMINAL 8X10 ST (GAUZE/BANDAGES/DRESSINGS) IMPLANT
DURAPREP 26ML APPLICATOR (WOUND CARE) ×2 IMPLANT
ELECT REM PT RETURN 9FT ADLT (ELECTROSURGICAL) ×2
ELECTRODE REM PT RTRN 9FT ADLT (ELECTROSURGICAL) ×1 IMPLANT
GAUZE SPONGE 4X4 12PLY STRL (GAUZE/BANDAGES/DRESSINGS) ×2 IMPLANT
GAUZE SPONGE 4X4 16PLY XRAY LF (GAUZE/BANDAGES/DRESSINGS) IMPLANT
GAUZE XEROFORM 1X8 LF (GAUZE/BANDAGES/DRESSINGS) ×2 IMPLANT
GLOVE BIO SURGEON STRL SZ 6.5 (GLOVE) ×2 IMPLANT
GLOVE BIOGEL PI IND STRL 6.5 (GLOVE) ×2 IMPLANT
GLOVE BIOGEL PI INDICATOR 6.5 (GLOVE) ×2
GOWN STRL REUS W/ TWL LRG LVL3 (GOWN DISPOSABLE) ×2 IMPLANT
GOWN STRL REUS W/TWL LRG LVL3 (GOWN DISPOSABLE) ×2
NDL SAFETY ECLIPSE 18X1.5 (NEEDLE) IMPLANT
NEEDLE HYPO 18GX1.5 SHARP (NEEDLE)
NEEDLE HYPO 25X1 1.5 SAFETY (NEEDLE) ×2 IMPLANT
NS IRRIG 1000ML POUR BTL (IV SOLUTION) IMPLANT
PACK BASIN DAY SURGERY FS (CUSTOM PROCEDURE TRAY) ×2 IMPLANT
PADDING CAST ABS 4INX4YD NS (CAST SUPPLIES) ×1
PADDING CAST ABS COTTON 4X4 ST (CAST SUPPLIES) ×1 IMPLANT
PENCIL BUTTON HOLSTER BLD 10FT (ELECTRODE) ×2 IMPLANT
SLEEVE SCD COMPRESS KNEE MED (MISCELLANEOUS) ×2 IMPLANT
STOCKINETTE 6  STRL (DRAPES) ×1
STOCKINETTE 6 STRL (DRAPES) ×1 IMPLANT
STOCKINETTE SYNTHETIC 4 NONSTR (MISCELLANEOUS) ×2 IMPLANT
STRIP SUTURE WOUND CLOSURE 1/2 (SUTURE) IMPLANT
SUT ETHIBOND 2-0 V-5 NEEDLE (SUTURE) IMPLANT
SUT ETHILON 3 0 PS 1 (SUTURE) ×2 IMPLANT
SUT ETHILON 4 0 PS 2 18 (SUTURE) ×2 IMPLANT
SUT MERSILENE 2.0 SH NDLE (SUTURE) IMPLANT
SUT MNCRL AB 3-0 PS2 18 (SUTURE) IMPLANT
SUT MNCRL AB 4-0 PS2 18 (SUTURE) IMPLANT
SUT MON AB 5-0 PS2 18 (SUTURE) IMPLANT
SUT VIC AB 3-0 FS2 27 (SUTURE) ×2 IMPLANT
SYR 10ML LL (SYRINGE) ×2 IMPLANT
SYR BULB 3OZ (MISCELLANEOUS) ×2 IMPLANT
TOWEL OR 17X24 6PK STRL BLUE (TOWEL DISPOSABLE) ×2 IMPLANT
UNDERPAD 30X30 (UNDERPADS AND DIAPERS) ×2 IMPLANT

## 2016-04-08 NOTE — Telephone Encounter (Signed)
Delydia called requesting form.  Pt scheduled to have surgery today (now).  Spoke with Dr. Larose Kells, who agreed to sign it and requested last note be faxed with it.  Last notes printed and faxed.  Called Delydia and made her aware.

## 2016-04-08 NOTE — Anesthesia Preprocedure Evaluation (Addendum)
Anesthesia Evaluation  Patient identified by MRN, date of birth, ID band Patient awake    Reviewed: Allergy & Precautions, NPO status , Patient's Chart, lab work & pertinent test results, reviewed documented beta blocker date and time   Airway Mallampati: II  TM Distance: >3 FB Neck ROM: Full    Dental  (+) Teeth Intact, Dental Advisory Given   Pulmonary neg pulmonary ROS,    breath sounds clear to auscultation       Cardiovascular hypertension, Pt. on medications and Pt. on home beta blockers  Rhythm:Regular Rate:Normal     Neuro/Psych PSYCHIATRIC DISORDERS Anxiety negative neurological ROS     GI/Hepatic negative GI ROS, Neg liver ROS,   Endo/Other  diabetes, Type 2, Insulin Dependent  Renal/GU negative Renal ROS  negative genitourinary   Musculoskeletal negative musculoskeletal ROS (+)   Abdominal   Peds negative pediatric ROS (+)  Hematology negative hematology ROS (+)   Anesthesia Other Findings   Reproductive/Obstetrics negative OB ROS                            Lab Results  Component Value Date   WBC 5.3 03/06/2016   HGB 12.5 (L) 03/06/2016   HCT 37.1 (L) 03/06/2016   MCV 82.3 03/06/2016   PLT 324.0 03/06/2016   Lab Results  Component Value Date   CREATININE 1.62 (H) 04/05/2016   BUN 23 (H) 04/05/2016   NA 136 04/05/2016   K 3.8 04/05/2016   CL 99 (L) 04/05/2016   CO2 30 04/05/2016   No results found for: INR, PROTIME   EKG: normal sinus rhythm, RBBB.   Anesthesia Physical Anesthesia Plan  ASA: II  Anesthesia Plan: MAC   Post-op Pain Management:    Induction: Intravenous  Airway Management Planned: Natural Airway  Additional Equipment:   Intra-op Plan:   Post-operative Plan:   Informed Consent: I have reviewed the patients History and Physical, chart, labs and discussed the procedure including the risks, benefits and alternatives for the proposed  anesthesia with the patient or authorized representative who has indicated his/her understanding and acceptance.     Plan Discussed with: CRNA  Anesthesia Plan Comments:         Anesthesia Quick Evaluation

## 2016-04-08 NOTE — Anesthesia Procedure Notes (Signed)
Procedure Name: MAC Date/Time: 04/08/2016 2:33 PM Performed by: Oneda Duffett D Pre-anesthesia Checklist: Patient identified, Emergency Drugs available, Suction available, Patient being monitored and Timeout performed Patient Re-evaluated:Patient Re-evaluated prior to inductionOxygen Delivery Method: Simple face mask

## 2016-04-08 NOTE — Op Note (Signed)
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  HEMOSTASIS: Right pneumatic ankle tourniquet set at 250 mmHg.  ESTIMATED BLOOD LOSS: Less than 5 mL.  MATERIALS: None.   COMPLICATIONS: None.  PATHOLOGY: Right hallux and distal 1st metatarsal sent for pathology.  INDICATIONS FOR PROCEDURE: The patient is a 56 y/o Diabetic male who presented today for surgical correction for the above-mentioned deformity. An MRI was ordered and was positive for bone infection to his right hallux. The patient was given various options for treatment including conservative and surgical care. The patient elected surgical treatment at this time. He is aware of the potential risks, benefits and outcomes this surgery entails. The patient has been n.p.o. since midnight. He has signed the consent form for surgery and has been medically cleared for surgery.  DESCRIPTION OF OPERATION: Under mild IV sedation, the patient was wheeled back into the operating room and placed on the operating table in a supine position. A well-padded right pneumatic ankle tourniquet was then placed above the patient's right leg and the above-mentioned cocktail was then injected to the right foot for anesthesia. The right foot was then scrubbed, prepped and draped in the usual aseptic manner. The foot was then esmarch for exsanguination. The tourniquet was then inflated.  Attention was then directed to the dorsomedial aspect of the patient's right foot where a racquet-type incision was made,  extending distally, looping around the hallux into the first and second digital web space. The incision was made in the routine fashion, single layer down to the level of bone. When doing so, there was no purulence noted at the incision site. The incision was carried deep down to the level of the first metatarsophalangeal joint where the hallux was disarticulated at that level. Once disarticulated, the first metatarsophalangeal joint, the hallux was removed from the operative field and placed on the back stable to be sent for pathologic analysis. The first metatarsal head was inspected with mild cartilage loss noted thus was transected at the metatarsal surgical neck and sesamoids removed and sent to pathology. Any remaining devitalizing necrotic or infected tissue was debrided as necessary. The extensor and flexor tendons were dissected as proximally as visible and transected at that level. The operative site was then irrigated utilizing normal sterile saline. Also, bleeders were bovied as necessary. Subcutaneous tissue and Skin closure was then performed utilizing 3-0 vicryl and 4-0 nylon in combination of simple interrupted suture fashion.  DISPOSITION: The patient tolerated the procedure and anesthesia well and was transferred to the recovery room with vital signs stable and vascular status intact to his right lower extremity. He will be monitored for a sufficient period of time before being discharged to home. Patient to follow up in office in 1 week and was give post op instructions and medications.  Troy Becker, DPM

## 2016-04-08 NOTE — H&P (Signed)
H&P: Podiatry   Troy Becker 56 y.o. male  patient with a history of chronic right 1st toe ulceration seen on several occassions in office for chronic ulceration with osteomyelitis; Patient has tried multiple conservative treatments and opted for Surgical management. Patient had pre-op physical and clearance for Right hallux amputation with removal of all infected bone and soft tissue completed. Patient met this AM in pre-op holding area; informed consent reviewed and signed. All questions answered. Right foot/surgical site marked.  This am patient denies any other pedal complaints. Denies Nausea/fever/vomiting/chills/night sweats/overnight events. Confirms NPO since midnight.   Patient Active Problem List   Diagnosis Date Noted  . PCP NOTES >>>>>>>>>>>>>>>>>> 10/23/2015  . Onychomycosis ? 06/03/2013  . Personal history of adenomatous colonic polyps 10/10/2011  . Annual physical exam 08/07/2011  . Encounter for long-term (current) use of other medications 08/05/2011  . Anxiety 07/21/2009  . ERECTILE DYSFUNCTION 11/05/2007  . Diabetes ([place]) 10/23/2006  . HYPERLIPIDEMIA 10/23/2006  . ELECTROCARDIOGRAM, ABNORMAL 10/23/2006  . HYPERTENSION 07/23/2006    No current facility-administered medications on file prior to encounter.    Current Outpatient Prescriptions on File Prior to Encounter  Medication Sig Dispense Refill  . aspirin 81 MG tablet Take 81 mg by mouth daily.      Marland Kitchen doxycycline (VIBRA-TABS) 100 MG tablet Take 1 tablet (100 mg total) by mouth 2 (two) times daily. 28 tablet 1  . insulin aspart protamine - aspart (NOVOLOG MIX 70/30 FLEXPEN) (70-30) 100 UNIT/ML FlexPen Inject 60 units daily 30 mL 2  . simvastatin (ZOCOR) 40 MG tablet Take 1 tablet (40 mg total) by mouth at bedtime. 30 tablet 5  . B-D ULTRAFINE III SHORT PEN 31G X 8 MM MISC USE WITH PEN INJECTIONS 100 each 0  . Blood Glucose Monitoring Suppl (ONE TOUCH ULTRA 2) W/DEVICE KIT 1 Device by Does not apply route  once. 1 each 0  . ONETOUCH DELICA LANCETS MISC Use as directed to check blood sugar twice daily dx 250.02 100 each 5  . sildenafil (VIAGRA) 100 MG tablet Take 0.5-1 tablets (50-100 mg total) by mouth daily as needed for erectile dysfunction. 3 tablet 3     No Known Allergies  Social History   Social History  . Marital status: Married    Spouse name: N/A  . Number of children: 2  . Years of education: N/A   Occupational History  . out of work      Social History Main Topics  . Smoking status: Never Smoker  . Smokeless tobacco: Never Used  . Alcohol use No  . Drug use: No  . Sexual activity: Not on file   Other Topics Concern  . Not on file   Social History Narrative   Household-- pt, wife, son          Past Surgical History:  Procedure Laterality Date  . COLONOSCOPY    . MULTIPLE TOOTH EXTRACTIONS      Family History  Problem Relation Age of Onset  . Coronary artery disease Father     CABG 68  . Hypertension Father   . Hypertension Mother   . Hypertension Sister   . Hypertension Brother   . Diabetes Brother     F M  . Colon cancer Neg Hx   . Prostate cancer Neg Hx      REVIEW OF SYSTEMS: Neurologic: Denies vertigo, syncope, convulsions or  headaches. Musculoskeletal: No muscle or joint pain. Cardiorespiratory:  Denies shortness of breath, dyspnea on exertion,  chest pain, cough or  hemoptysis. Gastrointestinal: Denies loose stool, Denies emesis, melena, constipationor rectal  bleeding. Genitourinary: No difficulty with voiding noted.  PHYSICAL EXAMINATION:  Today's Vitals   04/02/16 1234 04/08/16 1146  BP:  (!) 164/86  Pulse:  64  Resp:  18  Temp:  98.3 F (36.8 C)  TempSrc:  Oral  SpO2:  100%  Weight: 238 lb (108 kg) 240 lb (108.9 kg)  Height: 5' 11" (1.803 m) 5' 11" (1.803 m)    GENERAL: Well-developed, well-nourished, in no acute distress. Alert and cooperative. LOWER EXTREMITY EXAM: DERMATOLOGY: Crusted over  ulceration at right hallux plantar tuft. Skin warm and supple bilateral, no open lesions, nails within normal limits. VASCULAR: Palpable pedal pulses bilateral, Temperature gradient within normal limits, Capillary fill time 3 seconds, positive pedal hair growth present bilateral. NEUROLOGY: Gross sensation present with light touch bilateral MUSCULOSKELETAL: +foot deformity/ pain with palpation to area of concern at right hallux    MRI Consistent with Osteomyelitis, Right    ASSESSMENTS:  1.Right hallux osteomyelitis 2. 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 History and physical completed 2. Patient NPO since midnight 3. Previous Imaging reviewed  4. Consent for surgery explained and obtained for Right hallux amputation with removal of all nonviable bone and soft tissue; risk and benefits explained; all questions answered and no guarantees granted. 5. Patient to undergo above surgical procedure 6. Case discussed with patient and to meet with wife represented after the procedure 7. To resume all home meds post-procedure and to give prn pain meds, anti-nausea, and anti-constipation medications post op 8. Will continue to follow closely/ see post-operative in the office within 1 week.  Landis Martins, DPM

## 2016-04-08 NOTE — Discharge Instructions (Signed)

## 2016-04-08 NOTE — Transfer of Care (Signed)
Immediate Anesthesia Transfer of Care Note  Patient: Troy Becker  Procedure(s) Performed: Procedure(s): AMPUTATION OF TOE WITH METATARSAL RIGHT FOOT HALLUX (Right)  Patient Location: PACU  Anesthesia Type:MAC  Level of Consciousness: sedated  Airway & Oxygen Therapy: Patient Spontanous Breathing and Patient connected to face mask oxygen  Post-op Assessment: Report given to RN and Post -op Vital signs reviewed and stable  Post vital signs: Reviewed and stable  Last Vitals:  Vitals:   04/08/16 1146 04/08/16 1529  BP: (!) 164/86   Pulse: 64 65  Resp: 18 13  Temp: 36.8 C     Last Pain:  Vitals:   04/08/16 1146  TempSrc: Oral         Complications: No apparent anesthesia complications

## 2016-04-08 NOTE — Progress Notes (Signed)
Dr. Smith Robert reviewed EKG and labs, will proceed with surgery as scheduled.

## 2016-04-08 NOTE — Brief Op Note (Signed)
04/08/2016  3:27 PM  PATIENT:  Troy Becker  56 y.o. male  PRE-OPERATIVE DIAGNOSIS:  osteomylitis  POST-OPERATIVE DIAGNOSIS:  osteomyelitis  PROCEDURE:  Procedure(s): AMPUTATION OF TOE WITH METATARSAL RIGHT FOOT HALLUX (Right)  SURGEON:  Surgeon(s) and Role:    * Environmental consultant, DPM - Primary  PHYSICIAN ASSISTANT:   ASSISTANTS: None   ANESTHESIA:   MAC  EBL:  Total I/O In: 900 [I.V.:900] Out: -   BLOOD ADMINISTERED:none  DRAINS: none   LOCAL MEDICATIONS USED:  MARCAINE  And LIDOCAINE 20cc total   SPECIMEN:  Source of Specimen:  Right hallux and 1st metatarsal head  DISPOSITION OF SPECIMEN:  PATHOLOGY  COUNTS:  YES  TOURNIQUET:   Total Tourniquet Time Documented: Calf (Right) - 38 minutes Total: Calf (Right) - 38 minutes   DICTATION: .Note written in EPIC  PLAN OF CARE: Discharge to home after PACU  PATIENT DISPOSITION:  PACU - hemodynamically stable.   Delay start of Pharmacological VTE agent (>24hrs) due to surgical blood loss or risk of bleeding: not applicable

## 2016-04-08 NOTE — Anesthesia Postprocedure Evaluation (Signed)
Anesthesia Post Note  Patient: Troy Becker  Procedure(s) Performed: Procedure(s) (LRB): AMPUTATION OF TOE WITH METATARSAL RIGHT FOOT HALLUX (Right)  Patient location during evaluation: PACU Anesthesia Type: MAC Level of consciousness: awake and alert Pain management: pain level controlled Vital Signs Assessment: post-procedure vital signs reviewed and stable Respiratory status: spontaneous breathing, nonlabored ventilation, respiratory function stable and patient connected to nasal cannula oxygen Cardiovascular status: stable and blood pressure returned to baseline Anesthetic complications: no       Last Vitals:  Vitals:   04/08/16 1545 04/08/16 1600  BP: (!) 143/86 (!) 148/88  Pulse: (!) 57 61  Resp: 12 12  Temp:      Last Pain:  Vitals:   04/08/16 1529  TempSrc:   PainSc: 0-No pain                 Effie Berkshire

## 2016-04-09 ENCOUNTER — Encounter (HOSPITAL_BASED_OUTPATIENT_CLINIC_OR_DEPARTMENT_OTHER): Payer: Self-pay | Admitting: Sports Medicine

## 2016-04-09 ENCOUNTER — Telehealth: Payer: Self-pay | Admitting: Sports Medicine

## 2016-04-09 NOTE — Telephone Encounter (Signed)
Patient was also reminded to finish his Doxycycline antibiotics of which he has for 1 more week. -Dr. Cannon Kettle

## 2016-04-09 NOTE — Telephone Encounter (Signed)
Post op check phone call made to patient. Patient states that he feels fine with no discomfort. Patient asks when can he drive and start to walk more. I advised patient no driving and no excessive standing or walking. Patient may only walk for bathroom and food only. Patient expressed understanding. Patient instructed to continue with rest, elevation, and taking medications as needed. Patient to follow up as scheduled on next week. -Dr. Cannon Kettle

## 2016-04-10 NOTE — Progress Notes (Signed)
DOS 02.05.2018 Right hallux amputation to remove infected bone and ulcer.

## 2016-04-16 ENCOUNTER — Ambulatory Visit (INDEPENDENT_AMBULATORY_CARE_PROVIDER_SITE_OTHER): Payer: BLUE CROSS/BLUE SHIELD

## 2016-04-16 ENCOUNTER — Encounter: Payer: BLUE CROSS/BLUE SHIELD | Admitting: Sports Medicine

## 2016-04-16 ENCOUNTER — Ambulatory Visit (INDEPENDENT_AMBULATORY_CARE_PROVIDER_SITE_OTHER): Payer: Self-pay | Admitting: Sports Medicine

## 2016-04-16 DIAGNOSIS — Z794 Long term (current) use of insulin: Secondary | ICD-10-CM

## 2016-04-16 DIAGNOSIS — Z89411 Acquired absence of right great toe: Secondary | ICD-10-CM

## 2016-04-16 DIAGNOSIS — E119 Type 2 diabetes mellitus without complications: Secondary | ICD-10-CM

## 2016-04-16 DIAGNOSIS — L97514 Non-pressure chronic ulcer of other part of right foot with necrosis of bone: Secondary | ICD-10-CM

## 2016-04-16 DIAGNOSIS — M86171 Other acute osteomyelitis, right ankle and foot: Secondary | ICD-10-CM

## 2016-04-16 NOTE — Progress Notes (Signed)
Subjective: Troy Becker is a 56 y.o. male patient seen today in office for POV #1 (DOS 04-08-16), S/P Right 1st ray partial amputation. Patient denies pain at surgical site, denies calf pain, denies headache, chest pain, shortness of breath, nausea, vomiting, fever, or chills. Patient states that he is doing well and last took pain pill last wednesday. No other issues noted.   Patient Active Problem List   Diagnosis Date Noted  . PCP NOTES >>>>>>>>>>>>>>>>>> 10/23/2015  . Onychomycosis ? 06/03/2013  . Personal history of adenomatous colonic polyps 10/10/2011  . Annual physical exam 08/07/2011  . Encounter for long-term (current) use of other medications 08/05/2011  . Anxiety 07/21/2009  . ERECTILE DYSFUNCTION 11/05/2007  . Diabetes ([place]) 10/23/2006  . HYPERLIPIDEMIA 10/23/2006  . ELECTROCARDIOGRAM, ABNORMAL 10/23/2006  . HYPERTENSION 07/23/2006    Current Outpatient Prescriptions on File Prior to Visit  Medication Sig Dispense Refill  . amLODipine (NORVASC) 10 MG tablet Take 1 tablet (10 mg total) by mouth daily. 90 tablet 3  . aspirin 81 MG tablet Take 81 mg by mouth daily.      . B-D ULTRAFINE III SHORT PEN 31G X 8 MM MISC USE WITH PEN INJECTIONS 100 each 0  . Blood Glucose Monitoring Suppl (ONE TOUCH ULTRA 2) W/DEVICE KIT 1 Device by Does not apply route once. 1 each 0  . carvedilol (COREG) 6.25 MG tablet Take 1 tablet (6.25 mg total) by mouth 2 (two) times daily with a meal. 30 tablet 1  . docusate sodium (COLACE) 100 MG capsule Take 1 capsule (100 mg total) by mouth 2 (two) times daily. 10 capsule 0  . doxycycline (VIBRA-TABS) 100 MG tablet Take 1 tablet (100 mg total) by mouth 2 (two) times daily. 28 tablet 1  . HYDROcodone-acetaminophen (NORCO) 7.5-325 MG tablet Take 1 tablet by mouth every 6 (six) hours as needed for moderate pain. 20 tablet 0  . insulin aspart protamine - aspart (NOVOLOG MIX 70/30 FLEXPEN) (70-30) 100 UNIT/ML FlexPen Inject 60 units daily 30 mL 2  .  losartan-hydrochlorothiazide (HYZAAR) 100-25 MG tablet Take 1 tablet by mouth daily. 90 tablet 3  . ONETOUCH DELICA LANCETS MISC Use as directed to check blood sugar twice daily dx 250.02 100 each 5  . promethazine (PHENERGAN) 12.5 MG tablet Take 1 tablet (12.5 mg total) by mouth every 6 (six) hours as needed for nausea or vomiting. 30 tablet 0  . sildenafil (VIAGRA) 100 MG tablet Take 0.5-1 tablets (50-100 mg total) by mouth daily as needed for erectile dysfunction. 3 tablet 3  . simvastatin (ZOCOR) 40 MG tablet Take 1 tablet (40 mg total) by mouth at bedtime. 30 tablet 5   No current facility-administered medications on file prior to visit.     No Known Allergies  Objective: There were no vitals filed for this visit.  General: No acute distress, AAOx3  Right foot: Sutures intact with no gapping or dehiscence at surgical site, mild swelling to right foot amp site, no erythema, no warmth, no drainage, no signs of infection noted, Capillary fill time <3 seconds in all remaining digits, gross sensation present via light touch to right foot. No pain or crepitation with range of motion right foot. S/p right hallux + distal 1st met amputation. No pain with calf compression.   Post Op Xray, Right foot: amputation of hallux and distal 1st metatarsal, Soft tissue swelling within normal limits for post op status.   Assessment and Plan:  Problem List Items Addressed This Visit  None    Visit Diagnoses    Status post amputation of great toe, right (HCC)    -  Primary   Relevant Orders   DG Foot Complete Right   Other acute osteomyelitis of right foot (HCC)       Type 2 diabetes mellitus without complication, with long-term current use of insulin ([place])           -Patient seen and evaluated -Xrays right foot  -Reviewed path -Applied dry sterile dressing to surgical site right foot secured with ACE wrap and stockinet  -Advised patient to make sure to keep dressings clean, dry, and intact to  right surgical site, removing the ACE as needed  -Advised patient to continue with post-op shoe on right foot   -Advised patient to limit activity to necessity  -Advised patient to ice and elevate as necessary  -Will plan for suture removal at next office visit. In the meantime, patient to call office if any issues or problems arise.   Landis Martins, DPM

## 2016-04-23 ENCOUNTER — Ambulatory Visit (INDEPENDENT_AMBULATORY_CARE_PROVIDER_SITE_OTHER): Payer: Self-pay | Admitting: Sports Medicine

## 2016-04-23 ENCOUNTER — Encounter: Payer: BLUE CROSS/BLUE SHIELD | Admitting: Sports Medicine

## 2016-04-23 DIAGNOSIS — M79674 Pain in right toe(s): Secondary | ICD-10-CM

## 2016-04-23 DIAGNOSIS — Z794 Long term (current) use of insulin: Secondary | ICD-10-CM

## 2016-04-23 DIAGNOSIS — Z89411 Acquired absence of right great toe: Secondary | ICD-10-CM

## 2016-04-23 DIAGNOSIS — M86171 Other acute osteomyelitis, right ankle and foot: Secondary | ICD-10-CM

## 2016-04-23 DIAGNOSIS — E119 Type 2 diabetes mellitus without complications: Secondary | ICD-10-CM

## 2016-04-23 NOTE — Progress Notes (Signed)
Subjective: SEVILLE DOWNS is a 56 y.o. 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 Patient denies pain at surgical site, denies calf pain, denies headache, chest pain, shortness of breath, nausea, vomiting, fever, or chills. Patient states that he is doing well and staying off foot as instructed. No other issues noted.   Patient Active Problem List   Diagnosis Date Noted  . PCP NOTES >>>>>>>>>>>>>>>>>> 10/23/2015  . Onychomycosis ? 06/03/2013  . Personal history of adenomatous colonic polyps 10/10/2011  . Annual physical exam 08/07/2011  . Encounter for long-term (current) use of other medications 08/05/2011  . Anxiety 07/21/2009  . ERECTILE DYSFUNCTION 11/05/2007  . Diabetes ([place]) 10/23/2006  . HYPERLIPIDEMIA 10/23/2006  . ELECTROCARDIOGRAM, ABNORMAL 10/23/2006  . HYPERTENSION 07/23/2006    Current Outpatient Prescriptions on File Prior to Visit  Medication Sig Dispense Refill  . amLODipine (NORVASC) 10 MG tablet Take 1 tablet (10 mg total) by mouth daily. 90 tablet 3  . aspirin 81 MG tablet Take 81 mg by mouth daily.      . B-D ULTRAFINE III SHORT PEN 31G X 8 MM MISC USE WITH PEN INJECTIONS 100 each 0  . Blood Glucose Monitoring Suppl (ONE TOUCH ULTRA 2) W/DEVICE KIT 1 Device by Does not apply route once. 1 each 0  . carvedilol (COREG) 6.25 MG tablet Take 1 tablet (6.25 mg total) by mouth 2 (two) times daily with a meal. 30 tablet 1  . docusate sodium (COLACE) 100 MG capsule Take 1 capsule (100 mg total) by mouth 2 (two) times daily. 10 capsule 0  . doxycycline (VIBRA-TABS) 100 MG tablet Take 1 tablet (100 mg total) by mouth 2 (two) times daily. 28 tablet 1  . HYDROcodone-acetaminophen (NORCO) 7.5-325 MG tablet Take 1 tablet by mouth every 6 (six) hours as needed for moderate pain. 20 tablet 0  . insulin aspart protamine - aspart (NOVOLOG MIX 70/30 FLEXPEN) (70-30) 100 UNIT/ML FlexPen Inject 60 units daily 30 mL 2  .  losartan-hydrochlorothiazide (HYZAAR) 100-25 MG tablet Take 1 tablet by mouth daily. 90 tablet 3  . ONETOUCH DELICA LANCETS MISC Use as directed to check blood sugar twice daily dx 250.02 100 each 5  . promethazine (PHENERGAN) 12.5 MG tablet Take 1 tablet (12.5 mg total) by mouth every 6 (six) hours as needed for nausea or vomiting. 30 tablet 0  . sildenafil (VIAGRA) 100 MG tablet Take 0.5-1 tablets (50-100 mg total) by mouth daily as needed for erectile dysfunction. 3 tablet 3  . simvastatin (ZOCOR) 40 MG tablet Take 1 tablet (40 mg total) by mouth at bedtime. 30 tablet 5   No current facility-administered medications on file prior to visit.     No Known Allergies  Objective: There were no vitals filed for this visit.  General: No acute distress, AAOx3  Right foot: Sutures intact with no gapping or dehiscence at surgical site, mild swelling to right foot amp site, no erythema, no warmth, no drainage, no signs of infection noted, Capillary fill time <3 seconds in all remaining digits, gross sensation present via light touch to right foot. No pain or crepitation with range of motion right foot. S/p right hallux + distal 1st met amputation. No pain with calf compression.   Assessment and Plan:  Problem List Items Addressed This Visit    None    Visit Diagnoses    Status post amputation of great toe, right ([place])    -  Primary  Other acute osteomyelitis of right foot (HCC)       Type 2 diabetes mellitus without complication, with long-term current use of insulin (HCC)       Pain in toe of right foot           -Patient seen and evaluated -Every other suture was removed -Applied dry sterile dressing to surgical site right foot secured with ACE wrap and stockinet  -Advised patient to make sure to keep dressings clean, dry, and intact to right surgical site, removing the ACE as needed  -Advised patient to continue with post-op shoe on right foot   -Advised patient to limit activity to  necessity  -Advised patient to ice and elevate as necessary  -Will plan for finishing suture removal at next office visit. In the meantime, patient to call office if any issues or problems arise. To order diabetic shoes with toe filler insert for right at next office visit.   Landis Martins, DPM

## 2016-04-30 ENCOUNTER — Ambulatory Visit (INDEPENDENT_AMBULATORY_CARE_PROVIDER_SITE_OTHER): Payer: Self-pay | Admitting: Sports Medicine

## 2016-04-30 DIAGNOSIS — Z794 Long term (current) use of insulin: Secondary | ICD-10-CM

## 2016-04-30 DIAGNOSIS — Z89411 Acquired absence of right great toe: Secondary | ICD-10-CM

## 2016-04-30 DIAGNOSIS — E119 Type 2 diabetes mellitus without complications: Secondary | ICD-10-CM

## 2016-04-30 NOTE — Progress Notes (Signed)
Subjective: Troy Becker is a 56 y.o. 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 Patient denies pain at surgical site, denies calf pain, denies headache, chest pain, shortness of breath, nausea, vomiting, fever, or chills. Patient states that he is doing well and staying off foot as instructed and has not been driving or doing anything I did not advise. No other issues noted.   Patient Active Problem List   Diagnosis Date Noted  . PCP NOTES >>>>>>>>>>>>>>>>>> 10/23/2015  . Onychomycosis ? 06/03/2013  . Personal history of adenomatous colonic polyps 10/10/2011  . Annual physical exam 08/07/2011  . Encounter for long-term (current) use of other medications 08/05/2011  . Anxiety 07/21/2009  . ERECTILE DYSFUNCTION 11/05/2007  . Diabetes ([place]) 10/23/2006  . HYPERLIPIDEMIA 10/23/2006  . ELECTROCARDIOGRAM, ABNORMAL 10/23/2006  . HYPERTENSION 07/23/2006    Current Outpatient Prescriptions on File Prior to Visit  Medication Sig Dispense Refill  . amLODipine (NORVASC) 10 MG tablet Take 1 tablet (10 mg total) by mouth daily. 90 tablet 3  . aspirin 81 MG tablet Take 81 mg by mouth daily.      . B-D ULTRAFINE III SHORT PEN 31G X 8 MM MISC USE WITH PEN INJECTIONS 100 each 0  . Blood Glucose Monitoring Suppl (ONE TOUCH ULTRA 2) W/DEVICE KIT 1 Device by Does not apply route once. 1 each 0  . carvedilol (COREG) 6.25 MG tablet Take 1 tablet (6.25 mg total) by mouth 2 (two) times daily with a meal. 30 tablet 1  . docusate sodium (COLACE) 100 MG capsule Take 1 capsule (100 mg total) by mouth 2 (two) times daily. 10 capsule 0  . doxycycline (VIBRA-TABS) 100 MG tablet Take 1 tablet (100 mg total) by mouth 2 (two) times daily. 28 tablet 1  . HYDROcodone-acetaminophen (NORCO) 7.5-325 MG tablet Take 1 tablet by mouth every 6 (six) hours as needed for moderate pain. 20 tablet 0  . insulin aspart protamine - aspart (NOVOLOG MIX 70/30 FLEXPEN) (70-30) 100  UNIT/ML FlexPen Inject 60 units daily 30 mL 2  . losartan-hydrochlorothiazide (HYZAAR) 100-25 MG tablet Take 1 tablet by mouth daily. 90 tablet 3  . ONETOUCH DELICA LANCETS MISC Use as directed to check blood sugar twice daily dx 250.02 100 each 5  . promethazine (PHENERGAN) 12.5 MG tablet Take 1 tablet (12.5 mg total) by mouth every 6 (six) hours as needed for nausea or vomiting. 30 tablet 0  . sildenafil (VIAGRA) 100 MG tablet Take 0.5-1 tablets (50-100 mg total) by mouth daily as needed for erectile dysfunction. 3 tablet 3  . simvastatin (ZOCOR) 40 MG tablet Take 1 tablet (40 mg total) by mouth at bedtime. 30 tablet 5   No current facility-administered medications on file prior to visit.     No Known Allergies  Objective: There were no vitals filed for this visit.  General: No acute distress, AAOx3  Right foot: Remaining utures intact with no gapping or dehiscence at surgical site, mild swelling to right foot amp site, no erythema, no warmth, no drainage, no signs of infection noted, Capillary fill time <3 seconds in all remaining digits, gross sensation present via light touch to right foot. No pain or crepitation with range of motion right foot. S/p right hallux + distal 1st met amputation. No pain with calf compression.   Assessment and Plan:  Problem List Items Addressed This Visit    None    Visit Diagnoses    Status  post amputation of great toe, right ([place])    -  Primary   Type 2 diabetes mellitus without complication, with long-term current use of insulin ([place])           -Patient seen and evaluated -Sutures were removed; Applied Steristips; May shower on tomorrow and allow steristrips to follow off on their own  -Recommend cover area with clean sock -Advised patient to continue with post-op shoe on right foot   -Advised patient to limit activity to tolerance; May drive as long as he changes shoe or uses opposite foot  -Advised patient to ice and elevate as necessary  -Safe  step diabetic shoe order form was completed; office to contact primary care for approval / certification;  Office to arrange shoe fitting and dispensing. Patient will need custom insert with right toe filler -Will plan for check of incisional healing and xrays next visit. Patient to continue with post op shoe until can be casted or until he can find a spacious shoe that will not rub/irriate the amputation site. In the meantime, patient to call office if any issues or problems arise.  Landis Martins, DPM

## 2016-05-03 ENCOUNTER — Telehealth: Payer: Self-pay | Admitting: Internal Medicine

## 2016-05-03 NOTE — Telephone Encounter (Signed)
LMOM informing Pt to return call regarding latest BP readings or instructed to send via MyChart.

## 2016-05-03 NOTE — Telephone Encounter (Signed)
Please check on patient, how are  his ambulatory blood pressures? Let me know.

## 2016-05-14 ENCOUNTER — Ambulatory Visit: Payer: BLUE CROSS/BLUE SHIELD | Admitting: Sports Medicine

## 2016-05-21 ENCOUNTER — Ambulatory Visit (INDEPENDENT_AMBULATORY_CARE_PROVIDER_SITE_OTHER): Payer: BLUE CROSS/BLUE SHIELD

## 2016-05-21 ENCOUNTER — Ambulatory Visit (INDEPENDENT_AMBULATORY_CARE_PROVIDER_SITE_OTHER): Payer: Self-pay | Admitting: Sports Medicine

## 2016-05-21 DIAGNOSIS — Z9889 Other specified postprocedural states: Secondary | ICD-10-CM | POA: Diagnosis not present

## 2016-05-21 DIAGNOSIS — Z794 Long term (current) use of insulin: Secondary | ICD-10-CM

## 2016-05-21 DIAGNOSIS — Z89411 Acquired absence of right great toe: Secondary | ICD-10-CM

## 2016-05-21 DIAGNOSIS — E119 Type 2 diabetes mellitus without complications: Secondary | ICD-10-CM

## 2016-05-22 NOTE — Progress Notes (Signed)
Subjective: Troy Becker is a 56 y.o. male patient seen today in office for POV #4 (DOS 04-08-16), S/P Right 1st ray partial amputation. Patient denies pain at surgical site, denies calf pain, denies headache, chest pain, shortness of breath, nausea, vomiting, fever, or chills. Patient states that he is doing well and using post op shoe. No other issues noted.   Patient Active Problem List   Diagnosis Date Noted  . PCP NOTES >>>>>>>>>>>>>>>>>> 10/23/2015  . Onychomycosis ? 06/03/2013  . Personal history of adenomatous colonic polyps 10/10/2011  . Annual physical exam 08/07/2011  . Encounter for long-term (current) use of other medications 08/05/2011  . Anxiety 07/21/2009  . ERECTILE DYSFUNCTION 11/05/2007  . Diabetes ([place]) 10/23/2006  . HYPERLIPIDEMIA 10/23/2006  . ELECTROCARDIOGRAM, ABNORMAL 10/23/2006  . HYPERTENSION 07/23/2006    Current Outpatient Prescriptions on File Prior to Visit  Medication Sig Dispense Refill  . amLODipine (NORVASC) 10 MG tablet Take 1 tablet (10 mg total) by mouth daily. 90 tablet 3  . aspirin 81 MG tablet Take 81 mg by mouth daily.      . B-D ULTRAFINE III SHORT PEN 31G X 8 MM MISC USE WITH PEN INJECTIONS 100 each 0  . Blood Glucose Monitoring Suppl (ONE TOUCH ULTRA 2) W/DEVICE KIT 1 Device by Does not apply route once. 1 each 0  . carvedilol (COREG) 6.25 MG tablet Take 1 tablet (6.25 mg total) by mouth 2 (two) times daily with a meal. 30 tablet 1  . docusate sodium (COLACE) 100 MG capsule Take 1 capsule (100 mg total) by mouth 2 (two) times daily. 10 capsule 0  . doxycycline (VIBRA-TABS) 100 MG tablet Take 1 tablet (100 mg total) by mouth 2 (two) times daily. 28 tablet 1  . HYDROcodone-acetaminophen (NORCO) 7.5-325 MG tablet Take 1 tablet by mouth every 6 (six) hours as needed for moderate pain. 20 tablet 0  . insulin aspart protamine - aspart (NOVOLOG MIX 70/30 FLEXPEN) (70-30) 100 UNIT/ML FlexPen Inject 60 units daily 30 mL 2  .  losartan-hydrochlorothiazide (HYZAAR) 100-25 MG tablet Take 1 tablet by mouth daily. 90 tablet 3  . ONETOUCH DELICA LANCETS MISC Use as directed to check blood sugar twice daily dx 250.02 100 each 5  . promethazine (PHENERGAN) 12.5 MG tablet Take 1 tablet (12.5 mg total) by mouth every 6 (six) hours as needed for nausea or vomiting. 30 tablet 0  . sildenafil (VIAGRA) 100 MG tablet Take 0.5-1 tablets (50-100 mg total) by mouth daily as needed for erectile dysfunction. 3 tablet 3  . simvastatin (ZOCOR) 40 MG tablet Take 1 tablet (40 mg total) by mouth at bedtime. 30 tablet 5   No current facility-administered medications on file prior to visit.     No Known Allergies  Objective: There were no vitals filed for this visit.  General: No acute distress, AAOx3  Right foot: Incision well healed at surgical site, mild swelling to right foot amp site, no erythema, no warmth, no drainage, no signs of infection noted, Capillary fill time <3 seconds in all remaining digits, gross sensation present via light touch to right foot. No pain or crepitation with range of motion right foot. S/p right hallux + distal 1st met amputation. Lesser hammertoes. No pain with calf compression.   Xray right foot- amputation status, no other acute findings.  Assessment and Plan:  Problem List Items Addressed This Visit    None    Visit Diagnoses    S/P foot surgery, right    -  Primary   Relevant Orders   DG Foot Complete Right (Completed)   Status post amputation of great toe, right (HCC)       Type 2 diabetes mellitus without complication, with long-term current use of insulin ([place])           -Patient seen and evaluated -Recommend cover area with clean sock -Advised patient to continue with post-op shoe on right foot  For the next week and then slowly transition to soft mesh larger shoe with no rubbing to foot -Advised patient to limit activity to tolerance -Advised patient to ice and elevate as necessary   -Awaiting diabetic shoes and custom insert with right toe filler; Safestep form completed last visit -Will plan for final xrays next visit. In the meantime, patient to call office if any issues or problems arise.  Landis Martins, DPM

## 2016-06-06 ENCOUNTER — Ambulatory Visit: Payer: Self-pay | Admitting: Internal Medicine

## 2016-06-18 ENCOUNTER — Ambulatory Visit (INDEPENDENT_AMBULATORY_CARE_PROVIDER_SITE_OTHER): Payer: Self-pay | Admitting: Sports Medicine

## 2016-06-18 ENCOUNTER — Encounter: Payer: Self-pay | Admitting: Sports Medicine

## 2016-06-18 ENCOUNTER — Ambulatory Visit (INDEPENDENT_AMBULATORY_CARE_PROVIDER_SITE_OTHER): Payer: BLUE CROSS/BLUE SHIELD

## 2016-06-18 DIAGNOSIS — E119 Type 2 diabetes mellitus without complications: Secondary | ICD-10-CM | POA: Diagnosis not present

## 2016-06-18 DIAGNOSIS — Z794 Long term (current) use of insulin: Secondary | ICD-10-CM | POA: Diagnosis not present

## 2016-06-18 DIAGNOSIS — Z89411 Acquired absence of right great toe: Secondary | ICD-10-CM

## 2016-06-18 DIAGNOSIS — Z9889 Other specified postprocedural states: Secondary | ICD-10-CM

## 2016-06-18 NOTE — Progress Notes (Signed)
Subjective: Troy Becker is a 56 y.o. 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 Patient denies pain at surgical site, denies calf pain, denies headache, chest pain, shortness of breath, nausea, vomiting, fever, or chills. Patient states that he is doing well and using large sneaker. No other issues noted.   Patient Active Problem List   Diagnosis Date Noted  . PCP NOTES >>>>>>>>>>>>>>>>>> 10/23/2015  . Onychomycosis ? 06/03/2013  . Personal history of adenomatous colonic polyps 10/10/2011  . Annual physical exam 08/07/2011  . Encounter for long-term (current) use of other medications 08/05/2011  . Anxiety 07/21/2009  . ERECTILE DYSFUNCTION 11/05/2007  . Diabetes ([place]) 10/23/2006  . HYPERLIPIDEMIA 10/23/2006  . ELECTROCARDIOGRAM, ABNORMAL 10/23/2006  . HYPERTENSION 07/23/2006    Current Outpatient Prescriptions on File Prior to Visit  Medication Sig Dispense Refill  . amLODipine (NORVASC) 10 MG tablet Take 1 tablet (10 mg total) by mouth daily. 90 tablet 3  . aspirin 81 MG tablet Take 81 mg by mouth daily.      . B-D ULTRAFINE III SHORT PEN 31G X 8 MM MISC USE WITH PEN INJECTIONS 100 each 0  . Blood Glucose Monitoring Suppl (ONE TOUCH ULTRA 2) W/DEVICE KIT 1 Device by Does not apply route once. 1 each 0  . carvedilol (COREG) 6.25 MG tablet Take 1 tablet (6.25 mg total) by mouth 2 (two) times daily with a meal. 30 tablet 1  . docusate sodium (COLACE) 100 MG capsule Take 1 capsule (100 mg total) by mouth 2 (two) times daily. 10 capsule 0  . doxycycline (VIBRA-TABS) 100 MG tablet Take 1 tablet (100 mg total) by mouth 2 (two) times daily. 28 tablet 1  . HYDROcodone-acetaminophen (NORCO) 7.5-325 MG tablet Take 1 tablet by mouth every 6 (six) hours as needed for moderate pain. 20 tablet 0  . insulin aspart protamine - aspart (NOVOLOG MIX 70/30 FLEXPEN) (70-30) 100 UNIT/ML FlexPen Inject 60 units daily 30 mL 2  .  losartan-hydrochlorothiazide (HYZAAR) 100-25 MG tablet Take 1 tablet by mouth daily. 90 tablet 3  . ONETOUCH DELICA LANCETS MISC Use as directed to check blood sugar twice daily dx 250.02 100 each 5  . promethazine (PHENERGAN) 12.5 MG tablet Take 1 tablet (12.5 mg total) by mouth every 6 (six) hours as needed for nausea or vomiting. 30 tablet 0  . sildenafil (VIAGRA) 100 MG tablet Take 0.5-1 tablets (50-100 mg total) by mouth daily as needed for erectile dysfunction. 3 tablet 3  . simvastatin (ZOCOR) 40 MG tablet Take 1 tablet (40 mg total) by mouth at bedtime. 30 tablet 5   No current facility-administered medications on file prior to visit.     No Known Allergies  Objective: There were no vitals filed for this visit.  General: No acute distress, AAOx3  Right foot: Incision well healed at surgical site, minimal swelling to right foot amp site, no erythema, no warmth, no drainage, no signs of infection noted, Capillary fill time <3 seconds in all remaining digits, gross sensation present via light touch to right foot. No pain or crepitation with range of motion right foot. S/p right hallux + distal 1st met amputation. Lesser hammertoes. No pain with calf compression.   Xray right foot- amputation status, no other acute findings.  Assessment and Plan:  Problem List Items Addressed This Visit    None    Visit Diagnoses    Status post amputation of great toe, right ([place])    -  Primary   Relevant Orders   DG Foot Complete Right (Completed)   S/P foot surgery, right       Type 2 diabetes mellitus without complication, with long-term current use of insulin ([place])           -Patient seen and evaluated -Recommend cover area with clean sock -Advised patient to continue with soft mesh larger shoe with no rubbing to foot until custom insert is received  -Advised patient to limit activity to tolerance. No exercise until custom insoles are received.  -Advised patient to ice and elevate as  necessary  -Awaiting custom insert with right toe filler; Safestep form completed again this visit and patient met with Liliane Channel who obtained foam impression for insole -Will plan for final post op check next visit. In the meantime, patient to call office if any issues or problems arise.  Landis Martins, DPM

## 2016-06-19 ENCOUNTER — Other Ambulatory Visit: Payer: Self-pay | Admitting: Internal Medicine

## 2016-07-09 ENCOUNTER — Ambulatory Visit: Payer: BLUE CROSS/BLUE SHIELD | Admitting: *Deleted

## 2016-07-09 ENCOUNTER — Other Ambulatory Visit: Payer: BLUE CROSS/BLUE SHIELD

## 2016-07-09 DIAGNOSIS — Z794 Long term (current) use of insulin: Principal | ICD-10-CM

## 2016-07-09 DIAGNOSIS — E119 Type 2 diabetes mellitus without complications: Secondary | ICD-10-CM

## 2016-07-09 NOTE — Progress Notes (Signed)
Patient ID: Troy Becker, male   DOB: [birth year], 58 y.o.   MRN: [id number]  Patient presents for orthotic pick up.  Verbal and written break in and wear instructions given.  Patient will follow up in 4 weeks if symptoms worsen or fail to improve.

## 2016-07-09 NOTE — Patient Instructions (Signed)

## 2016-07-29 ENCOUNTER — Other Ambulatory Visit: Payer: Self-pay | Admitting: Internal Medicine

## 2016-08-02 NOTE — Addendum Note (Signed)
Addendum  created 08/02/16 1002 by Effie Berkshire, MD   Sign clinical note

## 2016-08-02 NOTE — Anesthesia Postprocedure Evaluation (Signed)
Anesthesia Post Note  Patient: Troy Becker  Procedure(s) Performed: Procedure(s) (LRB): AMPUTATION OF TOE WITH METATARSAL RIGHT FOOT HALLUX (Right)     Anesthesia Post Evaluation  Last Vitals:  Vitals:   04/08/16 1615 04/08/16 1629  BP: (!) 163/98 (!) 172/92  Pulse: 69 68  Resp: 13 14  Temp:  36.5 C    Last Pain:  Vitals:   04/08/16 1629  TempSrc:   PainSc: 0-No pain                 Effie Berkshire

## 2016-08-20 ENCOUNTER — Encounter: Payer: BLUE CROSS/BLUE SHIELD | Admitting: Sports Medicine

## 2016-09-06 ENCOUNTER — Encounter: Payer: BLUE CROSS/BLUE SHIELD | Admitting: Sports Medicine

## 2016-09-06 LAB — HM DIABETES EYE EXAM

## 2016-09-11 ENCOUNTER — Encounter: Payer: Self-pay | Admitting: Internal Medicine

## 2016-09-13 ENCOUNTER — Encounter: Payer: Self-pay | Admitting: Internal Medicine

## 2016-09-13 ENCOUNTER — Ambulatory Visit (INDEPENDENT_AMBULATORY_CARE_PROVIDER_SITE_OTHER): Payer: Self-pay | Admitting: Internal Medicine

## 2016-09-13 VITALS — BP 119/69 | HR 71 | Temp 98.4°F | Ht 71.0 in | Wt 224.1 lb

## 2016-09-13 DIAGNOSIS — Z114 Encounter for screening for human immunodeficiency virus [HIV]: Secondary | ICD-10-CM

## 2016-09-13 DIAGNOSIS — E1340 Other specified diabetes mellitus with diabetic neuropathy, unspecified: Secondary | ICD-10-CM

## 2016-09-13 DIAGNOSIS — R7989 Other specified abnormal findings of blood chemistry: Secondary | ICD-10-CM | POA: Diagnosis not present

## 2016-09-13 DIAGNOSIS — Z1159 Encounter for screening for other viral diseases: Secondary | ICD-10-CM

## 2016-09-13 DIAGNOSIS — N289 Disorder of kidney and ureter, unspecified: Secondary | ICD-10-CM

## 2016-09-13 DIAGNOSIS — E118 Type 2 diabetes mellitus with unspecified complications: Secondary | ICD-10-CM | POA: Diagnosis not present

## 2016-09-13 DIAGNOSIS — Z794 Long term (current) use of insulin: Secondary | ICD-10-CM | POA: Diagnosis not present

## 2016-09-13 DIAGNOSIS — I10 Essential (primary) hypertension: Secondary | ICD-10-CM | POA: Diagnosis not present

## 2016-09-13 NOTE — Progress Notes (Signed)
Subjective:    Patient ID: Troy Becker, male    DOB: 12-Mar-1960, 56 y.o.   MRN: [id number]  DOS:  09/13/2016 Type of visit - description :  Interval history: Osteomyelitis: Since the last visit had the right great toe amputated. Reports  is healing fine. HTN: BP today is very good, apparently not taking carvedilol. DM: His diet has changed significantly, less sodas, less CHO, has lost several pounds. Self  decrease her insulin to 40 units daily. CBGs ranged from 400-60   Review of Systems Denies chest pain or difficulty breathing. No nausea, vomiting, diarrhea  Past Medical History:  Diagnosis Date  . Abnormal EKG 2006   w/ a neg stress test  . Diabetes mellitus   . Hyperlipidemia   . Hypertension   . Osteomyelitis ([place])    rt big toe    Past Surgical History:  Procedure Laterality Date  . AMPUTATION TOE Right 04/08/2016   Procedure: AMPUTATION OF TOE WITH METATARSAL RIGHT FOOT HALLUX;  Surgeon: Troy Becker, DPM;  Location: [facility];  Service: Podiatry;  Laterality: Right;  . EYE SURGERY Right 08/22/2016   cataract  . MULTIPLE TOOTH EXTRACTIONS      Social History   Social History  . Marital status: Married    Spouse name: N/A  . Number of children: 2  . Years of education: N/A   Occupational History  . out of work      Social History Main Topics  . Smoking status: Never Smoker  . Smokeless tobacco: Never Used  . Alcohol use No  . Drug use: No  . Sexual activity: Not on file   Other Topics Concern  . Not on file   Social History Narrative   Household-- pt, wife, son            Allergies as of 09/13/2016   No Known Allergies     Medication List       Accurate as of 09/13/16 11:59 PM. Always use your most recent med list.          amLODipine 10 MG tablet Commonly known as:  NORVASC Take 1 tablet (10 mg total) by mouth daily.   aspirin 81 MG tablet Take 81 mg by mouth daily.   B-D ULTRAFINE III SHORT PEN 31G X 8 MM  Misc Generic drug:  Insulin Pen Needle USE WITH PEN INJECTIONS   docusate sodium 100 MG capsule Commonly known as:  COLACE Take 1 capsule (100 mg total) by mouth 2 (two) times daily.   losartan-hydrochlorothiazide 100-25 MG tablet Commonly known as:  HYZAAR Take 1 tablet by mouth daily.   NOVOLOG MIX 70/30 FLEXPEN (70-30) 100 UNIT/ML FlexPen Generic drug:  insulin aspart protamine - aspart Inject 0.4 mLs (40 Units total) into the skin daily.   ONE TOUCH ULTRA 2 w/Device Kit 1 Device by Does not apply route once.   ONETOUCH DELICA LANCETS Misc Use as directed to check blood sugar twice daily dx 250.02   sildenafil 100 MG tablet Commonly known as:  VIAGRA Take 0.5-1 tablets (50-100 mg total) by mouth daily as needed for erectile dysfunction.   simvastatin 40 MG tablet Commonly known as:  ZOCOR Take 1 tablet (40 mg total) by mouth at bedtime.          Objective:   Physical Exam BP 119/69 (BP Location: Left Arm, Patient Position: Sitting, Cuff Size: Normal)   Pulse 71   Temp 98.4 F (36.9 C) (Oral)  Ht _0  (1.803 m)   Wt 224 lb 2 oz (101.7 kg)   SpO2 100%   BMI 31.26 kg/m  General:   Well developed, well nourished . NAD.  HEENT:  Normocephalic . Face symmetric, atraumatic Lungs:  CTA B Normal respiratory effort, no intercostal retractions, no accessory muscle use. Heart: RRR,  no murmur.  No pretibial edema bilaterally  DM for exam:  Status post right great toe amputation, surgical scar with no redness or swelling. Pinprick examination: Very few patchy areas with decreased sensitivity. Good pedal pulses, no edema Neurologic:  alert & oriented X3.  Speech normal, gait appropriate for age and unassisted Psych--  Cognition and judgment appear intact.  Cooperative with normal attention span and concentration.  Behavior appropriate. No anxious or depressed appearing.      Assessment & Plan:   Assessment DM, Dr. Loanne Becker -Mild neuropathy per foot exam  10-2015 -R great toe amputation 04-2016 HTN Hyperlipidemia Abnormal EKG, negative stress test 2006 FH CAD-- F CABG age 48   PLAN: DM: Has not seen Dr. Loanne Becker yet, I noted weight loss, he reports doing better with diet. Hopefully weight loss will be due to diet improving and not uncontrolled DM. He is only taking 40 units of insulin, CBGs ranged from 60-400, to see Dr. Loanne Becker soon. Status post amputation, right great toe. Some neuropathy on exam. Feet care discussed HTN: Currently on Hyzaar and amlodipine. Was prescribed carvedilol few months ago but states he's not taking it. Check a BMP. High cholesterol: On simvastatin, last FLP satisfactory, he is also on amlodipine. Consider change to another statin Med compliance : encourage to review med list when he goes home and call if discrepancies  Primary care: Check a hep C and HIV. RTC 4 months

## 2016-09-13 NOTE — Patient Instructions (Addendum)
GO TO THE LAB : Get the blood work     GO TO THE FRONT DESK Schedule your next appointment for a  routine checkup in 4 months  Please review the medication list provided today, if you are taking medications differently please let me know.    Diabetes and Foot Care Diabetes may cause you to have problems because of poor blood supply (circulation) to your feet and legs. This may cause the skin on your feet to become thinner, break easier, and heal more slowly. Your skin may become dry, and the skin may peel and crack. You may also have nerve damage in your legs and feet causing decreased feeling in them. You may not notice minor injuries to your feet that could lead to infections or more serious problems. Taking care of your feet is one of the most important things you can do for yourself. Follow these instructions at home:  Wear shoes at all times, even in the house. Do not go barefoot. Bare feet are easily injured.  Check your feet daily for blisters, cuts, and redness. If you cannot see the bottom of your feet, use a mirror or ask someone for help.  Wash your feet with warm water (do not use hot water) and mild soap. Then pat your feet and the areas between your toes until they are completely dry. Do not soak your feet as this can dry your skin.  Apply a moisturizing lotion or petroleum jelly (that does not contain alcohol and is unscented) to the skin on your feet and to dry, brittle toenails. Do not apply lotion between your toes.  Trim your toenails straight across. Do not dig under them or around the cuticle. File the edges of your nails with an emery board or nail file.  Do not cut corns or calluses or try to remove them with medicine.  Wear clean socks or stockings every day. Make sure they are not too tight. Do not wear knee-high stockings since they may decrease blood flow to your legs.  Wear shoes that fit properly and have enough cushioning. To break in new shoes, wear them for  just a few hours a day. This prevents you from injuring your feet. Always look in your shoes before you put them on to be sure there are no objects inside.  Do not cross your legs. This may decrease the blood flow to your feet.  If you find a minor scrape, cut, or break in the skin on your feet, keep it and the skin around it clean and dry. These areas may be cleansed with mild soap and water. Do not cleanse the area with peroxide, alcohol, or iodine.  When you remove an adhesive bandage, be sure not to damage the skin around it.  If you have a wound, look at it several times a day to make sure it is healing.  Do not use heating pads or hot water bottles. They may burn your skin. If you have lost feeling in your feet or legs, you may not know it is happening until it is too late.  Make sure your health care provider performs a complete foot exam at least annually or more often if you have foot problems. Report any cuts, sores, or bruises to your health care provider immediately. Contact a health care provider if:  You have an injury that is not healing.  You have cuts or breaks in the skin.  You have an ingrown nail.  You  notice redness on your legs or feet.  You feel burning or tingling in your legs or feet.  You have pain or cramps in your legs and feet.  Your legs or feet are numb.  Your feet always feel cold. Get help right away if:  There is increasing redness, swelling, or pain in or around a wound.  There is a red line that goes up your leg.  Pus is coming from a wound.  You develop a fever or as directed by your health care provider.  You notice a bad smell coming from an ulcer or wound. This information is not intended to replace advice given to you by your health care provider. Make sure you discuss any questions you have with your health care provider. Document Released: 02/16/2000 Document Revised: 07/27/2015 Document Reviewed: 07/28/2012 Elsevier Interactive  Patient Education  2017 [employer].

## 2016-09-13 NOTE — Progress Notes (Signed)
Pre visit review using our clinic review tool, if applicable. No additional management support is needed unless otherwise documented below in the visit note. 

## 2016-09-14 LAB — BASIC METABOLIC PANEL
BUN: 29 mg/dL — AB (ref 7–25)
CHLORIDE: 93 mmol/L — AB (ref 98–110)
CO2: 26 mmol/L (ref 20–31)
Calcium: 9.6 mg/dL (ref 8.6–10.3)
Creat: 1.84 mg/dL — ABNORMAL HIGH (ref 0.70–1.33)
GLUCOSE: 296 mg/dL — AB (ref 65–99)
Potassium: 4.2 mmol/L (ref 3.5–5.3)
SODIUM: 134 mmol/L — AB (ref 135–146)

## 2016-09-14 LAB — HIV ANTIBODY (ROUTINE TESTING W REFLEX): HIV 1&2 Ab, 4th Generation: NONREACTIVE

## 2016-09-14 LAB — HEPATITIS C ANTIBODY: HCV AB: NEGATIVE

## 2016-09-14 NOTE — Assessment & Plan Note (Signed)
DM: Has not seen [name] yet, I noted weight loss, he reports doing better with diet. Hopefully weight loss will be due to diet improving and not uncontrolled DM. He is only taking 40 units of insulin, CBGs ranged from 60-400, to see [name] soon. Status post amputation, right great toe. Some neuropathy on exam. Feet care discussed HTN: Currently on Hyzaar and amlodipine. Was prescribed carvedilol few months ago but states he's not taking it. Check a BMP. High cholesterol: On simvastatin, last FLP satisfactory, he is also on amlodipine. Consider change to another statin Med compliance : encourage to review med list when he goes home and call if discrepancies  Primary care: Check a hep C and HIV. RTC 4 months

## 2016-09-18 ENCOUNTER — Ambulatory Visit: Payer: Self-pay | Admitting: Endocrinology

## 2016-09-18 ENCOUNTER — Telehealth: Payer: Self-pay | Admitting: Internal Medicine

## 2016-09-18 ENCOUNTER — Ambulatory Visit (HOSPITAL_BASED_OUTPATIENT_CLINIC_OR_DEPARTMENT_OTHER)
Admission: RE | Admit: 2016-09-18 | Discharge: 2016-09-18 | Disposition: A | Discharge status: Home / Self Care | Payer: BLUE CROSS/BLUE SHIELD | Source: Ambulatory Visit | Attending: Internal Medicine | Admitting: Internal Medicine

## 2016-09-18 DIAGNOSIS — R7989 Other specified abnormal findings of blood chemistry: Secondary | ICD-10-CM | POA: Diagnosis present

## 2016-09-18 DIAGNOSIS — N289 Disorder of kidney and ureter, unspecified: Secondary | ICD-10-CM | POA: Diagnosis present

## 2016-09-18 NOTE — Addendum Note (Signed)
Addended byDamita Dunnings D on: 09/18/2016 09:09 AM   Modules accepted: Orders

## 2016-09-18 NOTE — Telephone Encounter (Signed)
LMOM requesting call back

## 2016-09-18 NOTE — Telephone Encounter (Signed)
Notes recorded by Damita Dunnings, CMA on 09/18/2016 at 9:10 AM EDT [facility] informing Pt of lab results and recommendations. Pt has ENDO appt 09/20/2016 w/ Loanne Drilling. US renal ordered, and BMP ordered to be completed in 1 month. Instructed Pt to call if questions/concerns. ------  Notes recorded by Colon Branch, MD on 09/17/2016 at 6:35 PM EDT Please call the patient: Kidney function has deteriorated. Avoid OTCs such as ibuprofen, naproxen, "BCs" Keep hydrated, drink plenty of water. Schedule ultrasound of the kidneys, DX decreased kidney function, elevated creatinine. It is critical to keep his blood sugar control, needs to see endocrinology. Arrange a BMP in one month.

## 2016-09-18 NOTE — Telephone Encounter (Signed)
Pt is returning call.  

## 2016-09-20 ENCOUNTER — Ambulatory Visit (INDEPENDENT_AMBULATORY_CARE_PROVIDER_SITE_OTHER): Payer: BLUE CROSS/BLUE SHIELD | Admitting: Endocrinology

## 2016-09-20 ENCOUNTER — Encounter: Payer: Self-pay | Admitting: Endocrinology

## 2016-09-20 VITALS — BP 122/84 | HR 81 | Ht 71.0 in | Wt 229.0 lb

## 2016-09-20 DIAGNOSIS — E118 Type 2 diabetes mellitus with unspecified complications: Secondary | ICD-10-CM | POA: Diagnosis not present

## 2016-09-20 DIAGNOSIS — Z794 Long term (current) use of insulin: Secondary | ICD-10-CM

## 2016-09-20 MED ORDER — INSULIN ASPART PROT & ASPART (70-30 MIX) 100 UNIT/ML PEN: 60.0000 [IU] | PEN_INJECTOR | Freq: Every day | SUBCUTANEOUS | 11 refills | Status: DC

## 2016-09-20 NOTE — Progress Notes (Signed)
Subjective:    Patient ID: Troy Becker, male    DOB: 1961/01/19, 56 y.o.   MRN: [id number]  HPI Pt returns for f/u of diabetes mellitus: DM type: Insulin-requiring type 2. Dx'ed: 3154 Complications: none Therapy: insulin since 2010. DKA: never Severe hypoglycemia: never Pancreatitis: never Other: he has chosen qd insulin Interval history: he lost his insurance again, but he has since regained.  He says he missed the insulin during the gap in his insurance.  He has been back on insulin x approx 1 month.  no cbg record, but states cbg's vary from 70-500.  It is in general higher as the day goes on.  He takes 60 units qam.  she brings a record of her cbg's which I have reviewed today.  Past Medical History:  Diagnosis Date  . Abnormal EKG 2006   w/ a neg stress test  . Diabetes mellitus   . Hyperlipidemia   . Hypertension   . Osteomyelitis ([place])    rt big toe    Past Surgical History:  Procedure Laterality Date  . AMPUTATION TOE Right 04/08/2016   Procedure: AMPUTATION OF TOE WITH METATARSAL RIGHT FOOT HALLUX;  Surgeon: Landis Martins, DPM;  Location: [facility];  Service: Podiatry;  Laterality: Right;  . EYE SURGERY Right 08/22/2016   cataract  . MULTIPLE TOOTH EXTRACTIONS      Social History   Social History  . Marital status: Married    Spouse name: N/A  . Number of children: 2  . Years of education: N/A   Occupational History  . out of work      Social History Main Topics  . Smoking status: Never Smoker  . Smokeless tobacco: Never Used  . Alcohol use No  . Drug use: No  . Sexual activity: Not on file   Other Topics Concern  . Not on file   Social History Narrative   Household-- pt, wife, son          Current Outpatient Prescriptions on File Prior to Visit  Medication Sig Dispense Refill  . amLODipine (NORVASC) 10 MG tablet Take 1 tablet (10 mg total) by mouth daily. 90 tablet 3  . aspirin 81 MG tablet Take 81 mg by mouth daily.       . B-D ULTRAFINE III SHORT PEN 31G X 8 MM MISC USE WITH PEN INJECTIONS 100 each 0  . Blood Glucose Monitoring Suppl (ONE TOUCH ULTRA 2) W/DEVICE KIT 1 Device by Does not apply route once. 1 each 0  . docusate sodium (COLACE) 100 MG capsule Take 1 capsule (100 mg total) by mouth 2 (two) times daily. 10 capsule 0  . losartan-hydrochlorothiazide (HYZAAR) 100-25 MG tablet Take 1 tablet by mouth daily. 90 tablet 3  . ONETOUCH DELICA LANCETS MISC Use as directed to check blood sugar twice daily dx 250.02 100 each 5  . simvastatin (ZOCOR) 40 MG tablet Take 1 tablet (40 mg total) by mouth at bedtime. 15 tablet 0   No current facility-administered medications on file prior to visit.     No Known Allergies  Family History  Problem Relation Age of Onset  . Coronary artery disease Father        CABG 42  . Hypertension Father   . Hypertension Mother   . Hypertension Sister   . Hypertension Brother   . Diabetes Brother        F M  . Colon cancer Neg Hx   . Prostate cancer  Neg Hx     BP 122/84   Pulse 81   Ht _0  (1.803 m)   Wt 229 lb (103.9 kg)   SpO2 98%   BMI 31.94 kg/m    Review of Systems Denies LOC    Objective:   Physical Exam VITAL SIGNS:  See vs page.   GENERAL: no distress.  Pulses: foot pulses are intact bilaterally.   MSK: no deformity of the feet or ankles, except the right great toe is absent.   CV: no edema of the legs or ankles Skin:  no ulcer on the feet or ankles.  normal color and temp on the feet and ankles Neuro: sensation is intact to touch on the feet and ankles.   Ext: There is bilateral onychomycosis of the toenails      Assessment & Plan:  Insulin-requiring type 2 DM: Based on the pattern of her cbg's, she needs some adjustment in her therapy  Patient Instructions  A diabetes blood test is requested for you today.  We'll let you know about the results. If it is good, Please continue the same insulin.  If it is high, we'll change to 50/50  insulin Please come back for a follow-up appointment in 1 month.  check your blood sugar twice a day.  vary the time of day when you check, between before the 3 meals, and at bedtime.  also check if you have symptoms of your blood sugar being too high or too low.  please keep a record of the readings and bring it to your next appointment here.  please call us sooner if your blood sugar goes below 70, or if you have a lot of readings over 200.

## 2016-09-20 NOTE — Patient Instructions (Addendum)
A diabetes blood test is requested for you today.  We'll let you know about the results. If it is good, Please continue the same insulin.  If it is high, we'll change to 50/50 insulin Please come back for a follow-up appointment in 1 month.  check your blood sugar twice a day.  vary the time of day when you check, between before the 3 meals, and at bedtime.  also check if you have symptoms of your blood sugar being too high or too low.  please keep a record of the readings and bring it to your next appointment here.  please call us sooner if your blood sugar goes below 70, or if you have a lot of readings over 200.

## 2016-09-23 ENCOUNTER — Other Ambulatory Visit: Payer: Self-pay | Admitting: Endocrinology

## 2016-09-23 LAB — FRUCTOSAMINE: Fructosamine: 648 umol/L — ABNORMAL HIGH (ref 190–270)

## 2016-09-23 MED ORDER — INSULIN LISPRO PROT & LISPRO (50-50 MIX) 100 UNIT/ML KWIKPEN: 60.0000 [IU] | PEN_INJECTOR | Freq: Every day | SUBCUTANEOUS | 11 refills | Status: DC

## 2016-09-26 ENCOUNTER — Encounter: Payer: BLUE CROSS/BLUE SHIELD | Admitting: Sports Medicine

## 2016-10-02 ENCOUNTER — Telehealth: Payer: Self-pay

## 2016-10-02 ENCOUNTER — Other Ambulatory Visit: Payer: Self-pay

## 2016-10-02 MED ORDER — INSULIN ASPART PROT & ASPART (70-30 MIX) 100 UNIT/ML ~~LOC~~ SUSP: 60.0000 [IU] | Freq: Every day | SUBCUTANEOUS | 11 refills | Status: DC

## 2016-10-02 NOTE — Progress Notes (Unsigned)
Changed the humalog to novolog 70/30 per Dr. Cruzita Lederer per because of surance coverage.

## 2016-10-02 NOTE — Telephone Encounter (Signed)
PA is needed for Humalog 50/50 but insurance covers Novolog 70/30. Is it ok to switch patient so PA isn't needed?

## 2016-10-02 NOTE — Telephone Encounter (Signed)
Done and sent Novolog 70/30 into pharmacy.

## 2016-10-02 NOTE — Telephone Encounter (Signed)
OK 

## 2016-10-02 NOTE — Telephone Encounter (Signed)
Sent over Novolog 70/30 prescription to pharmacy.

## 2016-10-08 ENCOUNTER — Telehealth: Payer: Self-pay | Admitting: Internal Medicine

## 2016-10-08 NOTE — Telephone Encounter (Signed)
Chart reviewed, has not been prescribed potassium before. All recent potassium levels normal. Recommend OTC potassium supplements if he feels that is helping.

## 2016-10-08 NOTE — Telephone Encounter (Signed)
Please advise 

## 2016-10-08 NOTE — Telephone Encounter (Signed)
Pt says that he would like to have a Rx for potassium. Pt says that it seems like he recall taking medication before. Pt says that he sometimes have symptoms of low potassium and have a hx of low potassium. Pt said  That when he eat bananas he feel a lot better.   [facility]

## 2016-10-08 NOTE — Telephone Encounter (Signed)
Spoke w/ Pt, informed of recommendations. Pt verbalized understanding.  

## 2016-10-11 ENCOUNTER — Ambulatory Visit (INDEPENDENT_AMBULATORY_CARE_PROVIDER_SITE_OTHER): Payer: BLUE CROSS/BLUE SHIELD | Admitting: Sports Medicine

## 2016-10-11 DIAGNOSIS — Z89411 Acquired absence of right great toe: Secondary | ICD-10-CM

## 2016-10-11 DIAGNOSIS — Z794 Long term (current) use of insulin: Secondary | ICD-10-CM

## 2016-10-11 DIAGNOSIS — E119 Type 2 diabetes mellitus without complications: Secondary | ICD-10-CM | POA: Diagnosis not present

## 2016-10-11 NOTE — Progress Notes (Signed)
Subjective: Troy Becker is a 56 y.o. male patient seen today in office for POV #6 (DOS 04-08-16), S/P Right 1st ray partial amputation. Patient denies pain at surgical site. States that he is doing well with no problems. No other issues noted.   FBS has been elevated; does not recall number and thinks last A1c was 10  Patient Active Problem List   Diagnosis Date Noted  . PCP NOTES >>>>>>>>>>>>>>>>>> 10/23/2015  . Onychomycosis ? 06/03/2013  . Personal history of adenomatous colonic polyps 10/10/2011  . Annual physical exam 08/07/2011  . Encounter for long-term (current) use of other medications 08/05/2011  . Anxiety 07/21/2009  . ERECTILE DYSFUNCTION 11/05/2007  . Diabetes mellitus type 2 with complications ([place]) 36/14/4315  . HYPERLIPIDEMIA 10/23/2006  . ELECTROCARDIOGRAM, ABNORMAL 10/23/2006  . Essential hypertension 07/23/2006    Current Outpatient Prescriptions on File Prior to Visit  Medication Sig Dispense Refill  . amLODipine (NORVASC) 10 MG tablet Take 1 tablet (10 mg total) by mouth daily. 90 tablet 3  . aspirin 81 MG tablet Take 81 mg by mouth daily.      . B-D ULTRAFINE III SHORT PEN 31G X 8 MM MISC USE WITH PEN INJECTIONS 100 each 0  . Blood Glucose Monitoring Suppl (ONE TOUCH ULTRA 2) W/DEVICE KIT 1 Device by Does not apply route once. 1 each 0  . docusate sodium (COLACE) 100 MG capsule Take 1 capsule (100 mg total) by mouth 2 (two) times daily. 10 capsule 0  . insulin aspart protamine- aspart (NOVOLOG MIX 70/30) (70-30) 100 UNIT/ML injection Inject 0.6 mLs (60 Units total) into the skin daily. 10 mL 11  . losartan-hydrochlorothiazide (HYZAAR) 100-25 MG tablet Take 1 tablet by mouth daily. 90 tablet 3  . ONETOUCH DELICA LANCETS MISC Use as directed to check blood sugar twice daily dx 250.02 100 each 5  . simvastatin (ZOCOR) 40 MG tablet Take 1 tablet (40 mg total) by mouth at bedtime. 15 tablet 0   No current facility-administered medications on file prior to visit.      No Known Allergies  Objective: There were no vitals filed for this visit.  General: No acute distress, AAOx3  Right foot: Incision well healed at surgical site, minimal swelling to right foot amp site, no erythema, no warmth, no drainage, no signs of infection noted, Capillary fill time <3 seconds in all remaining digits, gross sensation present via light touch to right foot. No pain or crepitation with range of motion right foot. S/p right hallux + distal 1st met amputation. Lesser hammertoes. No pain with calf compression.   Assessment and Plan:  Problem List Items Addressed This Visit    None    Visit Diagnoses    Type 2 diabetes mellitus without complication, with long-term current use of insulin ([place])    -  Primary   Status post amputation of great toe, right ([place])           -Patient seen and evaluated -Patient is discharged from post op care and is to return in 3 months for routine diabetic foot care -Continue with custom inserts  -In the meantime, patient to call office if any issues or problems arise.  Landis Martins, DPM

## 2016-10-12 ENCOUNTER — Other Ambulatory Visit: Payer: Self-pay | Admitting: Internal Medicine

## 2016-10-14 ENCOUNTER — Other Ambulatory Visit: Payer: Self-pay

## 2016-10-14 MED ORDER — LOSARTAN POTASSIUM-HCTZ 100-25 MG PO TABS: 1.0000 | ORAL_TABLET | Freq: Every day | ORAL | 1 refills | Status: DC

## 2016-10-15 ENCOUNTER — Other Ambulatory Visit: Payer: Self-pay

## 2016-10-15 MED ORDER — INSULIN LISPRO PROT & LISPRO (75-25 MIX) 100 UNIT/ML KWIKPEN
PEN_INJECTOR | SUBCUTANEOUS | 3 refills | Status: DC
Start: 2016-10-15 — End: 2016-11-01

## 2016-10-27 ENCOUNTER — Other Ambulatory Visit: Payer: Self-pay | Admitting: Endocrinology

## 2016-11-01 ENCOUNTER — Encounter: Payer: Self-pay | Admitting: Endocrinology

## 2016-11-01 ENCOUNTER — Ambulatory Visit (INDEPENDENT_AMBULATORY_CARE_PROVIDER_SITE_OTHER): Payer: BLUE CROSS/BLUE SHIELD | Admitting: Endocrinology

## 2016-11-01 VITALS — BP 154/96 | HR 84 | Wt 234.8 lb

## 2016-11-01 DIAGNOSIS — Z794 Long term (current) use of insulin: Secondary | ICD-10-CM | POA: Diagnosis not present

## 2016-11-01 DIAGNOSIS — E118 Type 2 diabetes mellitus with unspecified complications: Secondary | ICD-10-CM

## 2016-11-01 LAB — POCT GLYCOSYLATED HEMOGLOBIN (HGB A1C): HEMOGLOBIN A1C: 12.9

## 2016-11-01 MED ORDER — INSULIN LISPRO PROT & LISPRO (75-25 MIX) 100 UNIT/ML KWIKPEN: 60.0000 [IU] | PEN_INJECTOR | Freq: Every day | SUBCUTANEOUS | 3 refills | Status: DC

## 2016-11-01 NOTE — Patient Instructions (Addendum)
Please continue the same insulin.  Please come back for a follow-up appointment in 1 month, when we'll recheck your blood pressure. check your blood sugar twice a day.  vary the time of day when you check, between before the 3 meals, and at bedtime.  also check if you have symptoms of your blood sugar being too high or too low.  please keep a record of the readings and bring it to your next appointment here.  please call us sooner if your blood sugar goes below 70, or if you have a lot of readings over 200.

## 2016-11-01 NOTE — Progress Notes (Signed)
Subjective:    Patient ID: Troy Becker, male    DOB: 28-Oct-1960, 56 y.o.   MRN: [id number]  HPI Pt returns for f/u of diabetes mellitus: DM type: Insulin-requiring type 2. Dx'ed: 8453 Complications: none Therapy: insulin since 2010. DKA: never.  Severe hypoglycemia: never.  Pancreatitis: never Other: he declines multiple daily injections.   Interval history: He says he sometimes misses the insulin.  no cbg record, but states cbg's vary from from 69-200's.  It is in general higher as the day goes on.  He takes 60 units qam.  pt states he feels well in general. Insurance refused 50/50 insulin.   Past Medical History:  Diagnosis Date  . Abnormal EKG 2006   w/ a neg stress test  . Diabetes mellitus   . Hyperlipidemia   . Hypertension   . Osteomyelitis ([place])    rt big toe    Past Surgical History:  Procedure Laterality Date  . AMPUTATION TOE Right 04/08/2016   Procedure: AMPUTATION OF TOE WITH METATARSAL RIGHT FOOT HALLUX;  Surgeon: Landis Martins, DPM;  Location: [facility];  Service: Podiatry;  Laterality: Right;  . EYE SURGERY Right 08/22/2016   cataract  . MULTIPLE TOOTH EXTRACTIONS      Social History   Social History  . Marital status: Married    Spouse name: N/A  . Number of children: 2  . Years of education: N/A   Occupational History  . out of work      Social History Main Topics  . Smoking status: Never Smoker  . Smokeless tobacco: Never Used  . Alcohol use No  . Drug use: No  . Sexual activity: Not on file   Other Topics Concern  . Not on file   Social History Narrative   Household-- pt, wife, son          Current Outpatient Prescriptions on File Prior to Visit  Medication Sig Dispense Refill  . amLODipine (NORVASC) 10 MG tablet Take 1 tablet (10 mg total) by mouth daily. 90 tablet 3  . aspirin 81 MG tablet Take 81 mg by mouth daily.      . B-D ULTRAFINE III SHORT PEN 31G X 8 MM MISC USE WITH PEN INJECTIONS 100 each 0  .  Blood Glucose Monitoring Suppl (ONE TOUCH ULTRA 2) W/DEVICE KIT 1 Device by Does not apply route once. 1 each 0  . docusate sodium (COLACE) 100 MG capsule Take 1 capsule (100 mg total) by mouth 2 (two) times daily. 10 capsule 0  . losartan-hydrochlorothiazide (HYZAAR) 100-25 MG tablet Take 1 tablet by mouth daily. 90 tablet 1  . ONETOUCH DELICA LANCETS MISC Use as directed to check blood sugar twice daily dx 250.02 100 each 5  . simvastatin (ZOCOR) 40 MG tablet Take 1 tablet (40 mg total) by mouth at bedtime. 15 tablet 0   No current facility-administered medications on file prior to visit.     No Known Allergies  Family History  Problem Relation Age of Onset  . Coronary artery disease Father        CABG 18  . Hypertension Father   . Hypertension Mother   . Hypertension Sister   . Hypertension Brother   . Diabetes Brother        F M  . Colon cancer Neg Hx   . Prostate cancer Neg Hx     BP (!) 154/96   Pulse 84   Wt 234 lb 12.8 oz (106.5 kg)  SpO2 98%   BMI 32.75 kg/m    Review of Systems Denies LOC.     Objective:   Physical Exam VITAL SIGNS:  See vs page GENERAL: no distress.  Pulses: foot pulses are intact bilaterally.   MSK: no deformity of the feet or ankles.  CV: 1+ bilat edema of the legs.  Skin:  no ulcer on the feet or ankles.  normal color and temp on the feet and ankles.  Neuro: sensation is intact to touch on the feet and ankles, but reduced from normal.    Ext; There is bilateral onychomycosis of the toenails.  Right great toenail is absent.    A1c=12.9%    Assessment & Plan:  Insulin-requiring type 2 DM: worse.   Noncompliance with cbg recording and insulin, persistent.   HTN: recheck next time.    Patient Instructions  Please continue the same insulin.  Please come back for a follow-up appointment in 1 month, when we'll recheck your blood pressure. check your blood sugar twice a day.  vary the time of day when you check, between before the 3  meals, and at bedtime.  also check if you have symptoms of your blood sugar being too high or too low.  please keep a record of the readings and bring it to your next appointment here.  please call us sooner if your blood sugar goes below 70, or if you have a lot of readings over 200.

## 2016-11-11 ENCOUNTER — Telehealth: Payer: Self-pay | Admitting: Internal Medicine

## 2016-11-11 NOTE — Telephone Encounter (Signed)
Advise patient, due for a BMP, DX hypertension,  please arrange

## 2016-11-12 NOTE — Telephone Encounter (Signed)
Letter printed and mailed to Pt instructing to call office for labs- overdue for BMP. BMP pending.

## 2016-11-18 ENCOUNTER — Telehealth: Payer: Self-pay | Admitting: Internal Medicine

## 2016-11-18 NOTE — Telephone Encounter (Signed)
LMOM informing Pt to return call.  

## 2016-11-18 NOTE — Telephone Encounter (Signed)
Spoke w/ Pt, he informed that he has previously discussed depression and anxiety issues w/ PCP before (see OV 03/2016); Pt finally willing to start medication. Please advise.

## 2016-11-18 NOTE — Telephone Encounter (Signed)
Please call the patient: If he has  any suicidal or violent thoughts needs to be seen right away or go to the ER. If he is not, recommend to start fluoxetine 20 mg tablets: Half tablet daily for one week then 1 tablet daily. #30, no refills. Also seek counseling Make him aware medicines may take a few days to "kick in" and also they may trigger suicidal thoughts in certain people, if that is the case he needs to go to the ER.  Office visit with me in 4 weeks.

## 2016-11-18 NOTE — Telephone Encounter (Signed)
Pt is requesting a call back at: [phone]

## 2016-11-19 ENCOUNTER — Encounter: Payer: Self-pay | Admitting: Internal Medicine

## 2016-11-19 MED ORDER — FLUOXETINE HCL 20 MG PO TABS: ORAL_TABLET | ORAL | 1 refills | Status: DC

## 2016-11-19 NOTE — Telephone Encounter (Signed)
Spoke w/ Pt, Pt declined suicidal and homicidal thoughts. Informed of recommendations. Pt agrees to trying fluoxetine, Pt reluctant to counseling but will keep option open. Fluoxetine 20mg  sent to [facility]. Appt scheduled for 12/23/2016.

## 2016-12-03 ENCOUNTER — Telehealth: Payer: Self-pay | Admitting: Endocrinology

## 2016-12-03 ENCOUNTER — Ambulatory Visit: Payer: Self-pay | Admitting: Endocrinology

## 2016-12-03 DIAGNOSIS — Z0289 Encounter for other administrative examinations: Secondary | ICD-10-CM

## 2016-12-03 NOTE — Telephone Encounter (Signed)
Patient unable to come to today's appointment due to being out of town. He rescheduled to 10/19.

## 2016-12-20 ENCOUNTER — Ambulatory Visit: Payer: Self-pay | Admitting: Endocrinology

## 2016-12-23 ENCOUNTER — Ambulatory Visit: Payer: Self-pay | Admitting: Internal Medicine

## 2016-12-23 DIAGNOSIS — Z0289 Encounter for other administrative examinations: Secondary | ICD-10-CM

## 2016-12-29 ENCOUNTER — Other Ambulatory Visit: Payer: Self-pay | Admitting: Internal Medicine

## 2017-01-15 ENCOUNTER — Ambulatory Visit: Payer: BLUE CROSS/BLUE SHIELD | Admitting: Sports Medicine

## 2017-01-17 ENCOUNTER — Telehealth: Payer: Self-pay | Admitting: Internal Medicine

## 2017-01-17 ENCOUNTER — Ambulatory Visit: Payer: Self-pay | Admitting: Internal Medicine

## 2017-01-17 NOTE — Telephone Encounter (Signed)
No charge. 

## 2017-01-17 NOTE — Telephone Encounter (Signed)
Patient cancelled 4pm appointment today and [facility] to Tuesday 01/21/17 with PCP, charge or charge

## 2017-01-21 ENCOUNTER — Ambulatory Visit: Payer: Self-pay | Admitting: Internal Medicine

## 2017-02-20 ENCOUNTER — Other Ambulatory Visit: Payer: Self-pay

## 2017-02-20 MED ORDER — AMLODIPINE BESYLATE 10 MG PO TABS: 10.0000 mg | ORAL_TABLET | Freq: Every day | ORAL | 0 refills | Status: DC

## 2017-03-09 ENCOUNTER — Other Ambulatory Visit: Payer: Self-pay | Admitting: Internal Medicine

## 2017-04-12 ENCOUNTER — Other Ambulatory Visit: Payer: Self-pay | Admitting: Internal Medicine

## 2017-04-23 ENCOUNTER — Other Ambulatory Visit: Payer: Self-pay

## 2017-04-23 MED ORDER — LOSARTAN POTASSIUM-HCTZ 100-25 MG PO TABS: 1.0000 | ORAL_TABLET | Freq: Every day | ORAL | 0 refills | Status: DC

## 2017-05-05 ENCOUNTER — Telehealth: Payer: Self-pay

## 2017-05-05 ENCOUNTER — Encounter: Payer: BLUE CROSS/BLUE SHIELD | Admitting: Internal Medicine

## 2017-05-05 NOTE — Telephone Encounter (Signed)
Multiple no show/cancellations:   05/05/2017: CPE- no show 01/21/2017: follow-up- cancellation 01/17/2017: follow-up- cancellation 12/23/2016: follow-up- no show 06/06/2016: follow-up-cancellation 10/20/2015: follow-up-no show 08/09/2015: follow-up: cancellation 07/21/2015: follow-up: cancellation 06/27/2015: follow-up: cancellation 06/01/2015- follow-up: no show  Would you like to begin dismissal process?

## 2017-05-05 NOTE — Telephone Encounter (Signed)
Not for now

## 2017-05-06 ENCOUNTER — Encounter: Payer: Self-pay | Admitting: Internal Medicine

## 2017-05-06 ENCOUNTER — Ambulatory Visit: Payer: BLUE CROSS/BLUE SHIELD | Admitting: Internal Medicine

## 2017-05-06 VITALS — BP 138/76 | HR 78 | Temp 98.5°F | Resp 16 | Ht 71.0 in | Wt 221.1 lb

## 2017-05-06 DIAGNOSIS — E1169 Type 2 diabetes mellitus with other specified complication: Secondary | ICD-10-CM

## 2017-05-06 DIAGNOSIS — E118 Type 2 diabetes mellitus with unspecified complications: Secondary | ICD-10-CM | POA: Diagnosis not present

## 2017-05-06 DIAGNOSIS — Z91199 Patient's noncompliance with other medical treatment and regimen due to unspecified reason: Secondary | ICD-10-CM

## 2017-05-06 DIAGNOSIS — E785 Hyperlipidemia, unspecified: Secondary | ICD-10-CM

## 2017-05-06 DIAGNOSIS — I10 Essential (primary) hypertension: Secondary | ICD-10-CM

## 2017-05-06 DIAGNOSIS — Z794 Long term (current) use of insulin: Secondary | ICD-10-CM

## 2017-05-06 DIAGNOSIS — R7989 Other specified abnormal findings of blood chemistry: Secondary | ICD-10-CM

## 2017-05-06 DIAGNOSIS — Z9119 Patient's noncompliance with other medical treatment and regimen: Secondary | ICD-10-CM

## 2017-05-06 MED ORDER — AMLODIPINE BESYLATE 10 MG PO TABS: 10.0000 mg | ORAL_TABLET | Freq: Every day | ORAL | 1 refills | Status: DC

## 2017-05-06 MED ORDER — SIMVASTATIN 40 MG PO TABS: 40.0000 mg | ORAL_TABLET | Freq: Every day | ORAL | 1 refills | Status: DC

## 2017-05-06 MED ORDER — FLUOXETINE HCL 20 MG PO TABS: 20.0000 mg | ORAL_TABLET | Freq: Every day | ORAL | 1 refills | Status: DC

## 2017-05-06 NOTE — Patient Instructions (Signed)
GO TO THE LAB : Get the blood work     GO TO THE FRONT DESK Schedule your next appointment for a follow-up in 2 months from today  Please call [name] today, ask  a refill on your insulin and an appointment with him  For your blood pressure, take amlodipine, will consider restart Hyzaar depending on your blood work.  Go back on fluoxetine: The first 10 days take only half tablet, then take 1 tablet daily.   Check the  blood pressure 2 or 3 times a   week   Be sure your blood pressure is between 110/65 and  135/85. If it is consistently higher or lower, let me know

## 2017-05-06 NOTE — Progress Notes (Signed)
Subjective:    Patient ID: Troy Becker, male    DOB: 20-Apr-1960, 57 y.o.   MRN: [id number]  DOS:  05/06/2017 Type of visit - description : f/u Interval history:  HTN, out of medications for ~ 3 weeks. DM: Has been taking his insulin now and then, on average 2 injections a week.  CBGs run from 70-300. High cholesterol: Off statins for 2-3 weeks anxiety depression: He was taking fluoxetine, it did help but he ran out.   Wt Readings from Last 3 Encounters:  05/06/17 221 lb 2 oz (100.3 kg)  11/01/16 234 lb 12.8 oz (106.5 kg)  09/20/16 229 lb (103.9 kg)    Review of Systems Denies chest pain or difficulty breathing No nausea, vomiting, diarrhea Has lost some weight, denies polydipsia or polyuria.  States his diet has improved.  Past Medical History:  Diagnosis Date  . Abnormal EKG 2006   w/ a neg stress test  . Diabetes mellitus   . Hyperlipidemia   . Hypertension   . Osteomyelitis ([place])    rt big toe    Past Surgical History:  Procedure Laterality Date  . AMPUTATION TOE Right 04/08/2016   Procedure: AMPUTATION OF TOE WITH METATARSAL RIGHT FOOT HALLUX;  Surgeon: Landis Martins, DPM;  Location: [facility] [place] [facility];  Service: Podiatry;  Laterality: Right;  . EYE SURGERY Right 08/22/2016   cataract  . MULTIPLE TOOTH EXTRACTIONS      Social History   Socioeconomic History  . Marital status: Married    Spouse name: Not on file  . Number of children: 2  . Years of education: Not on file  . Highest education level: Not on file  Social Needs  . Financial resource strain: Not on file  . Food insecurity - worry: Not on file  . Food insecurity - inability: Not on file  . Transportation needs - medical: Not on file  . Transportation needs - non-medical: Not on file  Occupational History  . Occupation: out of work    Tobacco Use  . Smoking status: Never Smoker  . Smokeless tobacco: Never Used  Substance and Sexual Activity  . Alcohol use: No  . Drug use: No  .  Sexual activity: Not on file  Other Topics Concern  . Not on file  Social History Narrative   Household-- pt, wife, son         Allergies as of 05/06/2017   No Known Allergies     Medication List        Accurate as of 05/06/17 11:59 PM. Always use your most recent med list.          amLODipine 10 MG tablet Commonly known as:  NORVASC Take 1 tablet (10 mg total) by mouth daily.   aspirin 81 MG tablet Take 81 mg by mouth daily.   B-D ULTRAFINE III SHORT PEN 31G X 8 MM Misc Generic drug:  Insulin Pen Needle USE WITH PEN INJECTIONS   docusate sodium 100 MG capsule Commonly known as:  COLACE Take 1 capsule (100 mg total) by mouth 2 (two) times daily.   FLUoxetine 20 MG tablet Commonly known as:  PROZAC Take 1 tablet (20 mg total) by mouth daily.   Insulin Lispro Prot & Lispro (75-25) 100 UNIT/ML Kwikpen Commonly known as:  HUMALOG MIX 75/25 KWIKPEN Inject 60 Units into the skin daily with breakfast. 60 units into the skin daily.   ONE TOUCH ULTRA 2 w/Device Kit 1 Device by Does not  apply route once.   ONETOUCH DELICA LANCETS Misc Use as directed to check blood sugar twice daily dx 250.02   simvastatin 40 MG tablet Commonly known as:  ZOCOR Take 1 tablet (40 mg total) by mouth at bedtime.          Objective:   Physical Exam BP 138/76 (BP Location: Left Arm, Patient Position: Sitting, Cuff Size: Normal)   Pulse 78   Temp 98.5 F (36.9 C) (Oral)   Resp 16   Ht _0  (1.803 m)   Wt 221 lb 2 oz (100.3 kg)   SpO2 98%   BMI 30.84 kg/m  General:   Well developed, well nourished . NAD.  HEENT:  Normocephalic . Face symmetric, atraumatic Lungs:  CTA B Normal respiratory effort, no intercostal retractions, no accessory muscle use. Heart: RRR,  no murmur.  Trace  pretibial edema bilaterally  Skin: Not pale. Not jaundice Neurologic:  alert & oriented X3.  Speech normal, gait some difficulty due to previous right great toe amputation Psych--  Cognition  and judgment appear intact.  Cooperative with normal attention span and concentration.  Behavior appropriate. No anxious or depressed appearing.      Assessment & Plan:    Assessment DM, [name] -Mild neuropathy per foot exam 10-2015 -R great toe amputation 04-2016 HTN Hyperlipidemia Anxiety depression Abnormal EKG, negative stress test 2006 FH CAD-- F CABG age 53 Poor compliance  PLAN: Poor compliance: Unfortunately, the patient is not taking his medications, states "there is no reason,  I just need to be more disciplined".  I reminded him that is a very risky behavior and uncontrolled diabetes will lead to more problems in the future.  He reports he is very aware of that. DM: Taking insulin 1 or 2 shots a week, CBGs ranged from 70-300.  Will put referral to [name], recommend patient to call his office ASAP and get a refill. HTN: Ran out of medications to 3 weeks ago, BP today looks very good, will refill amlodipine and get a BMP.  If BMP okay will consider restart Hyzaar. Hyperlipidemia: Refill statins Anxiety depression: Reports benefit from Prozac, has not taken it in a while, will restart, Rx sent slt increased TSH, recheck today RTC 2 months

## 2017-05-06 NOTE — Progress Notes (Signed)
Pre visit review using our clinic review tool, if applicable. No additional management support is needed unless otherwise documented below in the visit note. 

## 2017-05-07 DIAGNOSIS — Z91199 Patient's noncompliance with other medical treatment and regimen due to unspecified reason: Secondary | ICD-10-CM | POA: Insufficient documentation

## 2017-05-07 DIAGNOSIS — Z9119 Patient's noncompliance with other medical treatment and regimen: Secondary | ICD-10-CM | POA: Insufficient documentation

## 2017-05-07 LAB — BASIC METABOLIC PANEL
BUN: 22 mg/dL (ref 6–23)
CO2: 31 mEq/L (ref 19–32)
Calcium: 9.7 mg/dL (ref 8.4–10.5)
Chloride: 94 mEq/L — ABNORMAL LOW (ref 96–112)
Creatinine, Ser: 1.56 mg/dL — ABNORMAL HIGH (ref 0.40–1.50)
GFR: 58.96 mL/min — AB (ref >60.00)
Glucose, Bld: 343 mg/dL — ABNORMAL HIGH (ref 70–99)
POTASSIUM: 4.1 meq/L (ref 3.5–5.1)
SODIUM: 133 meq/L — AB (ref 135–145)

## 2017-05-07 LAB — TSH: TSH: 2.53 u[IU]/mL (ref 0.35–4.50)

## 2017-05-07 NOTE — Assessment & Plan Note (Signed)
Poor compliance: Unfortunately, the patient is not taking his medications, states "there is no reason,  I just need to be more disciplined".  I reminded him that is a very risky behavior and uncontrolled diabetes will lead to more problems in the future.  He reports he is very aware of that. DM: Taking insulin 1 or 2 shots a week, CBGs ranged from 70-300.  Will put referral to [name], recommend patient to call his office ASAP and get a refill. HTN: Ran out of medications to 3 weeks ago, BP today looks very good, will refill amlodipine and get a BMP.  If BMP okay will consider restart Hyzaar. Hyperlipidemia: Refill statins Anxiety depression: Reports benefit from Prozac, has not taken it in a while, will restart, Rx sent slt increased TSH, recheck today RTC 2 months

## 2017-05-08 ENCOUNTER — Telehealth: Payer: Self-pay | Admitting: Endocrinology

## 2017-05-08 NOTE — Telephone Encounter (Signed)
-----   Message from Colon Branch, MD sent at 05/07/2017  4:24 PM EST ----- Regarding: CBG 300s Troy Becker, this patient has an appointment with you in April, could you consider see him earlier ? Unfortunately, compliance is his main issue THX!

## 2017-05-08 NOTE — Telephone Encounter (Signed)
I called LVM for patient to please reschedule 06/02/17 appt to first available since his blood sugars are running high.

## 2017-05-08 NOTE — Telephone Encounter (Signed)
Sarah: Please move up his appt to next available, thanks

## 2017-05-26 ENCOUNTER — Other Ambulatory Visit: Payer: Self-pay

## 2017-05-26 MED ORDER — AMLODIPINE BESYLATE 10 MG PO TABS: 10.0000 mg | ORAL_TABLET | Freq: Every day | ORAL | 0 refills | Status: DC

## 2017-06-02 ENCOUNTER — Ambulatory Visit: Payer: BLUE CROSS/BLUE SHIELD | Admitting: Endocrinology

## 2017-06-02 ENCOUNTER — Encounter: Payer: Self-pay | Admitting: Endocrinology

## 2017-06-02 VITALS — BP 182/90 | HR 80 | Wt 220.6 lb

## 2017-06-02 DIAGNOSIS — Z794 Long term (current) use of insulin: Secondary | ICD-10-CM | POA: Diagnosis not present

## 2017-06-02 DIAGNOSIS — L97519 Non-pressure chronic ulcer of other part of right foot with unspecified severity: Secondary | ICD-10-CM

## 2017-06-02 DIAGNOSIS — L97509 Non-pressure chronic ulcer of other part of unspecified foot with unspecified severity: Secondary | ICD-10-CM | POA: Insufficient documentation

## 2017-06-02 DIAGNOSIS — E118 Type 2 diabetes mellitus with unspecified complications: Secondary | ICD-10-CM | POA: Diagnosis not present

## 2017-06-02 LAB — POCT GLYCOSYLATED HEMOGLOBIN (HGB A1C): Hemoglobin A1C: >15

## 2017-06-02 MED ORDER — INSULIN DEGLUDEC 100 UNIT/ML ~~LOC~~ SOPN: 40.0000 [IU] | PEN_INJECTOR | Freq: Every day | SUBCUTANEOUS | 11 refills | Status: DC

## 2017-06-02 NOTE — Patient Instructions (Addendum)
Please see a wound care specialist.  you will receive a phone call, about a day and time for an appointment. I have sent a prescription to your pharmacy, to change the insulin to "[place]."  With this insulin, if you miss it in the morning, you can take the full amount later in the day.  check your blood sugar twice a day.  vary the time of day when you check, between before the 3 meals, and at bedtime.  also check if you have symptoms of your blood sugar being too high or too low.  please keep a record of the readings and bring it to your next appointment here.  please call us sooner if your blood sugar goes below 70, or if you have a lot of readings over 200.

## 2017-06-02 NOTE — Progress Notes (Signed)
Subjective:    Patient ID: Troy Becker, male    DOB: 02/04/1961, 57 y.o.   MRN: [id number]  HPI Pt returns for f/u of diabetes mellitus: DM type: Insulin-requiring type 2. Dx'ed: 4174 Complications: renal failure, polyneuropathy, and foot ulcer.  Therapy: insulin since 2010. DKA: never.  Severe hypoglycemia: never.  Pancreatitis: never Other: he declines multiple daily injections.   Pt reports symptoms: he has intermitt generalized weakness.  He has mild hypoglycemia.  CBG's are reported.  He says it varies from 60-400.  Pt says he often misses the insulin.   Past Medical History:  Diagnosis Date  . Abnormal EKG 2006   w/ a neg stress test  . Diabetes mellitus   . Hyperlipidemia   . Hypertension   . Osteomyelitis ([place])    rt big toe    Past Surgical History:  Procedure Laterality Date  . AMPUTATION TOE Right 04/08/2016   Procedure: AMPUTATION OF TOE WITH METATARSAL RIGHT FOOT HALLUX;  Surgeon: Landis Martins, DPM;  Location: [place];  Service: Podiatry;  Laterality: Right;  . EYE SURGERY Right 08/22/2016   cataract  . MULTIPLE TOOTH EXTRACTIONS      Social History   Socioeconomic History  . Marital status: Married    Spouse name: Not on file  . Number of children: 2  . Years of education: Not on file  . Highest education level: Not on file  Occupational History  . Occupation: out of work    Social Needs  . Financial resource strain: Not on file  . Food insecurity:    Worry: Not on file    Inability: Not on file  . Transportation needs:    Medical: Not on file    Non-medical: Not on file  Tobacco Use  . Smoking status: Never Smoker  . Smokeless tobacco: Never Used  Substance and Sexual Activity  . Alcohol use: No  . Drug use: No  . Sexual activity: Not on file  Lifestyle  . Physical activity:    Days per week: Not on file    Minutes per session: Not on file  . Stress: Not on file  Relationships  . Social connections:    Talks  on phone: Not on file    Gets together: Not on file    Attends religious service: Not on file    Active member of club or organization: Not on file    Attends meetings of clubs or organizations: Not on file    Relationship status: Not on file  . Intimate partner violence:    Fear of current or ex partner: Not on file    Emotionally abused: Not on file    Physically abused: Not on file    Forced sexual activity: Not on file  Other Topics Concern  . Not on file  Social History Narrative   Household-- pt, wife, son       Current Outpatient Medications on File Prior to Visit  Medication Sig Dispense Refill  . amLODipine (NORVASC) 10 MG tablet Take 1 tablet (10 mg total) by mouth daily. 90 tablet 0  . aspirin 81 MG tablet Take 81 mg by mouth daily.      . B-D ULTRAFINE III SHORT PEN 31G X 8 MM MISC USE WITH PEN INJECTIONS 100 each 0  . Blood Glucose Monitoring Suppl (ONE TOUCH ULTRA 2) W/DEVICE KIT 1 Device by Does not apply route once. 1 each 0  . docusate sodium (COLACE) 100 MG  capsule Take 1 capsule (100 mg total) by mouth 2 (two) times daily. 10 capsule 0  . FLUoxetine (PROZAC) 20 MG tablet Take 1 tablet (20 mg total) by mouth daily. 30 tablet 1  . ONETOUCH DELICA LANCETS MISC Use as directed to check blood sugar twice daily dx 250.02 100 each 5  . simvastatin (ZOCOR) 40 MG tablet Take 1 tablet (40 mg total) by mouth at bedtime. 30 tablet 1   No current facility-administered medications on file prior to visit.     No Known Allergies  Family History  Problem Relation Age of Onset  . Coronary artery disease Father        CABG 43  . Hypertension Father   . Hypertension Mother   . Hypertension Sister   . Hypertension Brother   . Diabetes Brother        F M  . Colon cancer Neg Hx   . Prostate cancer Neg Hx     BP (!) 182/90 (BP Location: Left Arm, Patient Position: Sitting, Cuff Size: Normal)   Pulse 80   Wt 220 lb 9.6 oz (100.1 kg)   SpO2 98%   BMI 30.77 kg/m     Review of Systems He has lost a few lbs.      Objective:   Physical Exam VITAL SIGNS:  See vs page GENERAL: no distress Pulses: foot pulses are intact bilaterally.   MSK: no deformity of the feet or ankles.  CV: trace bilat edema of the legs.  Skin:  Deep 3x1 cm ulcer at the plantar aspect of the right foot.  normal color and temp on the feet and ankles.  Neuro: sensation is intact to touch on the feet and ankles, but reduced from normal.    Ext: There is severe bilateral onychomycosis of the toenails.  Right great toenail is absent.    a1c is > 15%  Lab Results  Component Value Date   CREATININE 1.56 (H) 05/06/2017   BUN 22 05/06/2017   NA 133 (L) 05/06/2017   K 4.1 05/06/2017   CL 94 (L) 05/06/2017   CO2 31 05/06/2017      Assessment & Plan:  Foot ulcer, new.  Insulin-requiring type 2 DM, with renal failure: therapy limited by noncompliance.    Patient Instructions  Please see a wound care specialist.  you will receive a phone call, about a day and time for an appointment. I have sent a prescription to your pharmacy, to change the insulin to "[place]."  With this insulin, if you miss it in the morning, you can take the full amount later in the day.  check your blood sugar twice a day.  vary the time of day when you check, between before the 3 meals, and at bedtime.  also check if you have symptoms of your blood sugar being too high or too low.  please keep a record of the readings and bring it to your next appointment here.  please call us sooner if your blood sugar goes below 70, or if you have a lot of readings over 200.

## 2017-06-04 ENCOUNTER — Telehealth: Payer: Self-pay | Admitting: Endocrinology

## 2017-06-04 NOTE — Telephone Encounter (Signed)
insulin degludec (TRESIBA FLEXTOUCH) 100 UNIT/ML SOPN FlexTouch Pen   Patient needs prescription sent to a different pharmacy.  Patient would like it sent to  [facility], [place]

## 2017-06-05 ENCOUNTER — Other Ambulatory Visit: Payer: Self-pay

## 2017-06-05 MED ORDER — INSULIN DEGLUDEC 100 UNIT/ML ~~LOC~~ SOPN: 40.0000 [IU] | PEN_INJECTOR | Freq: Every day | SUBCUTANEOUS | 11 refills | Status: DC

## 2017-06-05 NOTE — Telephone Encounter (Signed)
I have sent for patient.  

## 2017-06-24 ENCOUNTER — Telehealth: Payer: Self-pay | Admitting: Internal Medicine

## 2017-06-24 NOTE — Telephone Encounter (Signed)
Copied from [place] [id number]. Topic: Quick Communication - Rx Refill/Question >> Jun 24, 2017 12:50 PM [name], [place] C wrote: Medication: FLUoxetine (PROZAC) 20 MG tablet, simvastatin (ZOCOR) 40 MG tablet, Losartan   Has the patient contacted their pharmacy?no (Agent: If no, request that the patient contact the pharmacy for the refill.)  Preferred Pharmacy (with phone number or street name): [facility] on [address] and [address].   Agent: Please be advised that RX refills may take up to 3 business days. We ask that you follow-up with your pharmacy.

## 2017-06-25 MED ORDER — SIMVASTATIN 40 MG PO TABS: 40.0000 mg | ORAL_TABLET | Freq: Every day | ORAL | 0 refills | Status: DC

## 2017-06-25 MED ORDER — FLUOXETINE HCL 20 MG PO TABS: 20.0000 mg | ORAL_TABLET | Freq: Every day | ORAL | 0 refills | Status: DC

## 2017-07-04 ENCOUNTER — Telehealth: Payer: Self-pay | Admitting: Internal Medicine

## 2017-07-04 MED ORDER — FLUOXETINE HCL 20 MG PO CAPS: 20.0000 mg | ORAL_CAPSULE | Freq: Every day | ORAL | 1 refills | Status: DC

## 2017-07-04 NOTE — Telephone Encounter (Signed)
Capsules sent to [facility].

## 2017-07-04 NOTE — Telephone Encounter (Signed)
Copied from [place] [id number]. Topic: Quick Communication - Rx Refill/Question >> Jul 04, 2017 12:20 PM Neva Seat wrote: FLUoxetine (PROZAC) 20 MG tablet  -PLEASE CHANGE to capsule form  - will cheaper for pt to pay     [facility] at [place], [place] [address] Phone: [phone] Fax: [phone]

## 2017-07-04 NOTE — Telephone Encounter (Signed)
Ok to change

## 2017-07-04 NOTE — Addendum Note (Signed)
Addended byDamita Dunnings D on: 07/04/2017 05:06 PM   Modules accepted: Orders

## 2017-07-04 NOTE — Telephone Encounter (Signed)
Please advise 

## 2017-07-04 NOTE — Telephone Encounter (Signed)
[place] at [employer] [employer] and spoke to [place], [facility] about changing from prozac tablets to capsules, he says to send in a new prescription.  Last filled:06/24/17 PCP:[facility]: [facility] at [place], [place] - [address] [phone] (Phone) [phone] (Fax)

## 2017-07-08 ENCOUNTER — Ambulatory Visit: Payer: BLUE CROSS/BLUE SHIELD | Admitting: Internal Medicine

## 2017-08-04 ENCOUNTER — Ambulatory Visit: Payer: Self-pay | Admitting: Endocrinology

## 2017-08-04 ENCOUNTER — Ambulatory Visit: Payer: Self-pay | Admitting: Internal Medicine

## 2017-08-13 ENCOUNTER — Ambulatory Visit: Payer: Self-pay | Admitting: Internal Medicine

## 2017-08-13 ENCOUNTER — Telehealth: Payer: Self-pay

## 2017-08-13 DIAGNOSIS — Z0289 Encounter for other administrative examinations: Secondary | ICD-10-CM

## 2017-08-13 NOTE — Telephone Encounter (Signed)
-----   Message from Colon Branch, MD sent at 08/13/2017  4:49 PM EDT ----- Regarding:  Open phone note, start dismissal process   ----- Message ----- From: Selina Cooley Sent: 08/13/2017  10:48 AM To: Colon Branch, MD Subject: No Show                                        Appointment : 08-13-2017  Charge or No Charge ?

## 2017-08-13 NOTE — Telephone Encounter (Signed)
Dismissal letter printed, signed and given to [place], Engineer, building services.

## 2017-08-15 ENCOUNTER — Telehealth: Payer: Self-pay | Admitting: Internal Medicine

## 2017-08-15 NOTE — Telephone Encounter (Signed)
Patient dismissed from [facility] by Kathlene November MD, effective August 13, 2017. Dismissal letter sent out by certified / registered mail.  daj

## 2017-09-12 NOTE — Telephone Encounter (Signed)
Certified dismissal letter returned as undeliverable, unclaimed, return to sender after three attempts by [employer] on September 12, 2017. Letter placed in another envelope and resent as 1st class mail which does not require a signature. daj

## 2017-09-26 ENCOUNTER — Ambulatory Visit: Payer: 59 | Admitting: Endocrinology

## 2017-10-08 ENCOUNTER — Other Ambulatory Visit: Payer: Self-pay | Admitting: Internal Medicine

## 2017-10-22 ENCOUNTER — Ambulatory Visit (HOSPITAL_COMMUNITY)
Admission: EM | Admit: 2017-10-22 | Discharge: 2017-10-22 | Disposition: A | Discharge status: Home / Self Care | Payer: Managed Care, Other (non HMO) | Attending: Family Medicine | Admitting: Family Medicine

## 2017-10-22 ENCOUNTER — Encounter: Payer: Self-pay | Admitting: Endocrinology

## 2017-10-22 ENCOUNTER — Encounter (HOSPITAL_COMMUNITY): Payer: Self-pay | Admitting: Emergency Medicine

## 2017-10-22 ENCOUNTER — Ambulatory Visit (INDEPENDENT_AMBULATORY_CARE_PROVIDER_SITE_OTHER): Payer: 59 | Admitting: Endocrinology

## 2017-10-22 ENCOUNTER — Other Ambulatory Visit: Payer: Self-pay

## 2017-10-22 VITALS — BP 180/110 | HR 85 | Temp 99.0°F | Ht 71.0 in | Wt 221.4 lb

## 2017-10-22 DIAGNOSIS — I10 Essential (primary) hypertension: Secondary | ICD-10-CM | POA: Diagnosis not present

## 2017-10-22 DIAGNOSIS — E118 Type 2 diabetes mellitus with unspecified complications: Secondary | ICD-10-CM | POA: Diagnosis not present

## 2017-10-22 DIAGNOSIS — E785 Hyperlipidemia, unspecified: Secondary | ICD-10-CM | POA: Diagnosis not present

## 2017-10-22 DIAGNOSIS — Z760 Encounter for issue of repeat prescription: Secondary | ICD-10-CM

## 2017-10-22 DIAGNOSIS — F329 Major depressive disorder, single episode, unspecified: Secondary | ICD-10-CM

## 2017-10-22 DIAGNOSIS — F3289 Other specified depressive episodes: Secondary | ICD-10-CM | POA: Diagnosis not present

## 2017-10-22 DIAGNOSIS — E7800 Pure hypercholesterolemia, unspecified: Secondary | ICD-10-CM

## 2017-10-22 DIAGNOSIS — Z794 Long term (current) use of insulin: Secondary | ICD-10-CM

## 2017-10-22 DIAGNOSIS — F32A Depression, unspecified: Secondary | ICD-10-CM

## 2017-10-22 LAB — POCT GLYCOSYLATED HEMOGLOBIN (HGB A1C): HbA1c POC (<> result, manual entry): >15 % (ref 4.0–5.6)

## 2017-10-22 MED ORDER — INSULIN DEGLUDEC 100 UNIT/ML ~~LOC~~ SOPN: 40.0000 [IU] | PEN_INJECTOR | Freq: Every day | SUBCUTANEOUS | 11 refills | Status: DC

## 2017-10-22 MED ORDER — SIMVASTATIN 40 MG PO TABS: 40.0000 mg | ORAL_TABLET | Freq: Every day | ORAL | 0 refills | Status: AC

## 2017-10-22 MED ORDER — INSULIN DEGLUDEC 100 UNIT/ML ~~LOC~~ SOPN: 40.0000 [IU] | PEN_INJECTOR | Freq: Every day | SUBCUTANEOUS | 11 refills | Status: AC

## 2017-10-22 MED ORDER — FLUOXETINE HCL 20 MG PO CAPS: 20.0000 mg | ORAL_CAPSULE | Freq: Every day | ORAL | 0 refills | Status: DC

## 2017-10-22 MED ORDER — AMLODIPINE BESYLATE 10 MG PO TABS: 10.0000 mg | ORAL_TABLET | Freq: Every day | ORAL | 0 refills | Status: DC

## 2017-10-22 NOTE — ED Triage Notes (Signed)
Requesting medication refills: Amlodipine 10mg  qd Docusate sodium 100mg  BID Fluoxetine 20mg  qd Simvastatin 40mg  hs    Patient has been out of the above medicines for 2 months  Patient is on insulin, but has this medication

## 2017-10-22 NOTE — Patient Instructions (Addendum)
Your blood pressure is high today.  Please see your primary care provider soon, to have it rechecked Please continue the same "Tresiba."  With this insulin, if you miss it in the morning, you can take the full amount later in the day, or even the next day. I have sent a prescription to your pharmacy, to refill.  check your blood sugar twice a day.  vary the time of day when you check, between before the 3 meals, and at bedtime.  also check if you have symptoms of your blood sugar being too high or too low.  please keep a record of the readings and bring it to your next appointment here.  please call us sooner if your blood sugar goes below 70, or if you have a lot of readings over 200.   Please come back for a follow-up appointment in 2 months.

## 2017-10-22 NOTE — Progress Notes (Signed)
Subjective:    Patient ID: Troy Becker, male    DOB: March 10, 1960, 57 y.o.   MRN: [id number]  HPI Pt returns for f/u of diabetes mellitus: DM type: Insulin-requiring type 2. Dx'ed: 1740 Complications: renal failure, polyneuropathy, and foot ulcer.  Therapy: insulin since 2010. DKA: never.  Severe hypoglycemia: never.  Pancreatitis: never Other: he declines multiple daily injections.   Pt reports symptoms: he has intermitt generalized weakness.  He has mild hypoglycemia.  CBG's are reported.  He says it varies from 60-400.  Pt says he still sometimes misses the insulin and norvasc.  He has regained insurance.  pt states he feels well in general.   Past Medical History:  Diagnosis Date  . Abnormal EKG 2006   w/ a neg stress test  . Diabetes mellitus   . Hyperlipidemia   . Hypertension   . Osteomyelitis ([place])    rt big toe    Past Surgical History:  Procedure Laterality Date  . AMPUTATION TOE Right 04/08/2016   Procedure: AMPUTATION OF TOE WITH METATARSAL RIGHT FOOT HALLUX;  Surgeon: Landis Martins, DPM;  Location: [place];  Service: Podiatry;  Laterality: Right;  . EYE SURGERY Right 08/22/2016   cataract  . MULTIPLE TOOTH EXTRACTIONS      Social History   Socioeconomic History  . Marital status: Married    Spouse name: Not on file  . Number of children: 2  . Years of education: Not on file  . Highest education level: Not on file  Occupational History  . Occupation: out of work    Social Needs  . Financial resource strain: Not on file  . Food insecurity:    Worry: Not on file    Inability: Not on file  . Transportation needs:    Medical: Not on file    Non-medical: Not on file  Tobacco Use  . Smoking status: Never Smoker  . Smokeless tobacco: Never Used  Substance and Sexual Activity  . Alcohol use: No  . Drug use: No  . Sexual activity: Not on file  Lifestyle  . Physical activity:    Days per week: Not on file    Minutes per session:  Not on file  . Stress: Not on file  Relationships  . Social connections:    Talks on phone: Not on file    Gets together: Not on file    Attends religious service: Not on file    Active member of club or organization: Not on file    Attends meetings of clubs or organizations: Not on file    Relationship status: Not on file  . Intimate partner violence:    Fear of current or ex partner: Not on file    Emotionally abused: Not on file    Physically abused: Not on file    Forced sexual activity: Not on file  Other Topics Concern  . Not on file  Social History Narrative   Household-- pt, wife, son       Current Outpatient Medications on File Prior to Visit  Medication Sig Dispense Refill  . aspirin 81 MG tablet Take 81 mg by mouth daily.      . B-D ULTRAFINE III SHORT PEN 31G X 8 MM MISC USE WITH PEN INJECTIONS 100 each 0  . Blood Glucose Monitoring Suppl (ONE TOUCH ULTRA 2) W/DEVICE KIT 1 Device by Does not apply route once. 1 each 0  . docusate sodium (COLACE) 100 MG capsule Take 1  capsule (100 mg total) by mouth 2 (two) times daily. 10 capsule 0  . ONETOUCH DELICA LANCETS MISC Use as directed to check blood sugar twice daily dx 250.02 100 each 5   No current facility-administered medications on file prior to visit.     No Known Allergies  Family History  Problem Relation Age of Onset  . Coronary artery disease Father        CABG 52  . Hypertension Father   . Hypertension Mother   . Hypertension Sister   . Hypertension Brother   . Diabetes Brother        F M  . Colon cancer Neg Hx   . Prostate cancer Neg Hx     BP (!) 180/110 (BP Location: Right Arm, Patient Position: Sitting, Cuff Size: Normal)   Pulse 85   Temp 99 F (37.2 C) (Oral)   Ht _0  (1.803 m)   Wt 221 lb 6.4 oz (100.4 kg)   SpO2 97%   BMI 30.88 kg/m    Review of Systems deneis n/v/sob    Objective:   Physical Exam VITAL SIGNS:  See vs page GENERAL: no distress Pulses: foot pulses are  intact bilaterally.   MSK: no deformity of the feet or ankles.  CV: 2+ bilat edema of the legs.  Skin: no foot ulcer normal color and temp on the feet and ankles.  Neuro: sensation is intact to touch on the feet and ankles, but reduced from normal.    Ext: There is severe bilateral onychomycosis of the toenails.  Right great toe is absent.     Lab Results  Component Value Date   HGBA1C >15% 10/22/2017      Assessment & Plan:  HTN: is noted today Insulin-requiring type 2 DM, with renal failure: very poor glycemic control.  Noncompliance with meds: we discussed  Patient Instructions  Your blood pressure is high today.  Please see your primary care provider soon, to have it rechecked Please continue the same "Tresiba."  With this insulin, if you miss it in the morning, you can take the full amount later in the day, or even the next day. I have sent a prescription to your pharmacy, to refill.  check your blood sugar twice a day.  vary the time of day when you check, between before the 3 meals, and at bedtime.  also check if you have symptoms of your blood sugar being too high or too low.  please keep a record of the readings and bring it to your next appointment here.  please call us sooner if your blood sugar goes below 70, or if you have a lot of readings over 200.   Please come back for a follow-up appointment in 2 months.

## 2017-10-23 ENCOUNTER — Telehealth: Payer: Self-pay | Admitting: Endocrinology

## 2017-10-23 NOTE — Telephone Encounter (Signed)
Patient last seen 11/01/16

## 2017-10-23 NOTE — Telephone Encounter (Signed)
Was refilled yesterday.

## 2017-10-23 NOTE — Telephone Encounter (Signed)
Per [facility] states he is trying to get a precription refill for his high blood pressure. RN Assessment-Caller states his blood pressure was 180/110 this morning and he is out of blood pressure medication. Care Advice - See PCP within 24 hours.

## 2017-10-24 ENCOUNTER — Other Ambulatory Visit: Payer: Self-pay

## 2017-10-28 NOTE — ED Provider Notes (Signed)
[place]   [id number] 10/22/17 Arrival Time: 1903  ASSESSMENT & PLAN:  1. Medication refill   2. Essential hypertension   3. High cholesterol   4. Depression, unspecified depression type     Meds ordered this encounter  Medications  . amLODipine (NORVASC) 10 MG tablet    Sig: Take 1 tablet (10 mg total) by mouth daily.    Dispense:  90 tablet    Refill:  0  . FLUoxetine (PROZAC) 20 MG capsule    Sig: Take 1 capsule (20 mg total) by mouth daily.    Dispense:  90 capsule    Refill:  0    Requested drug refills are authorized, however, the patient needs further evaluation and/or laboratory testing before further refills are given. Ask him to make an appointment for this.  . simvastatin (ZOCOR) 40 MG tablet    Sig: Take 1 tablet (40 mg total) by mouth at bedtime.    Dispense:  90 tablet    Refill:  0   Refills given. He plans f/u with PCP.  Reviewed expectations re: course of current medical issues. Questions answered. Outlined signs and symptoms indicating need for more acute intervention. Patient verbalized understanding. After Visit Summary given.   SUBJECTIVE: History from: patient. Troy Becker is a 57 y.o. male who presents requesting medication refill. No current concerns.  Current medical problems include:  Past Medical History:  Diagnosis Date  . Abnormal EKG 2006   w/ a neg stress test  . Diabetes mellitus   . Hyperlipidemia   . Hypertension   . Osteomyelitis ([place])    rt big toe     No current facility-administered medications for this encounter.   Current Outpatient Medications:  .  amLODipine (NORVASC) 10 MG tablet, Take 1 tablet (10 mg total) by mouth daily., Disp: 90 tablet, Rfl: 0 .  aspirin 81 MG tablet, Take 81 mg by mouth daily.  , Disp: , Rfl:  .  B-D ULTRAFINE III SHORT PEN 31G X 8 MM MISC, USE WITH PEN INJECTIONS, Disp: 100 each, Rfl: 0 .  Blood Glucose Monitoring Suppl (ONE TOUCH ULTRA 2) W/DEVICE KIT, 1 Device by Does  not apply route once., Disp: 1 each, Rfl: 0 .  docusate sodium (COLACE) 100 MG capsule, Take 1 capsule (100 mg total) by mouth 2 (two) times daily., Disp: 10 capsule, Rfl: 0 .  FLUoxetine (PROZAC) 20 MG capsule, Take 1 capsule (20 mg total) by mouth daily., Disp: 90 capsule, Rfl: 0 .  insulin degludec (TRESIBA FLEXTOUCH) 100 UNIT/ML SOPN FlexTouch Pen, Inject 0.4 mLs (40 Units total) into the skin daily. And pen needles 1/day., Disp: 5 pen, Rfl: 11 .  ONETOUCH DELICA LANCETS MISC, Use as directed to check blood sugar twice daily dx 250.02, Disp: 100 each, Rfl: 5 .  simvastatin (ZOCOR) 40 MG tablet, Take 1 tablet (40 mg total) by mouth at bedtime., Disp: 90 tablet, Rfl: 0  ROS: As per HPI.   OBJECTIVE:  Vitals:   10/22/17 1930 10/22/17 1933  BP: (!) 204/102 (!) 180/101  Pulse: 88   Resp: 18   Temp: 98.4 F (36.9 C)   TempSrc: Oral   SpO2: 99%     General appearance: alert; no distress Eyes: PERRLA; EOMI; conjunctiva normal Lungs: clear to auscultation bilaterally Heart: regular rate and rhythm Abdomen: soft, non-tender Extremities: no dema; symmetrical with no gross deformities Skin: warm and dry Psychological: alert and cooperative; normal mood and affect  No Known Allergies  Past Medical History:  Diagnosis Date  . Abnormal EKG 2006   w/ a neg stress test  . Diabetes mellitus   . Hyperlipidemia   . Hypertension   . Osteomyelitis ([place])    rt big toe   Social History   Socioeconomic History  . Marital status: Married    Spouse name: Not on file  . Number of children: 2  . Years of education: Not on file  . Highest education level: Not on file  Occupational History  . Occupation: out of work    Social Needs  . Financial resource strain: Not on file  . Food insecurity:    Worry: Not on file    Inability: Not on file  . Transportation needs:    Medical: Not on file    Non-medical: Not on file  Tobacco Use  . Smoking status: Never Smoker  . Smokeless tobacco:  Never Used  Substance and Sexual Activity  . Alcohol use: No  . Drug use: No  . Sexual activity: Not on file  Lifestyle  . Physical activity:    Days per week: Not on file    Minutes per session: Not on file  . Stress: Not on file  Relationships  . Social connections:    Talks on phone: Not on file    Gets together: Not on file    Attends religious service: Not on file    Active member of club or organization: Not on file    Attends meetings of clubs or organizations: Not on file    Relationship status: Not on file  . Intimate partner violence:    Fear of current or ex partner: Not on file    Emotionally abused: Not on file    Physically abused: Not on file    Forced sexual activity: Not on file  Other Topics Concern  . Not on file  Social History Narrative   Household-- pt, wife, son      Family History  Problem Relation Age of Onset  . Coronary artery disease Father        CABG 61  . Hypertension Father   . Hypertension Mother   . Hypertension Sister   . Hypertension Brother   . Diabetes Brother        F M  . Colon cancer Neg Hx   . Prostate cancer Neg Hx    Past Surgical History:  Procedure Laterality Date  . AMPUTATION TOE Right 04/08/2016   Procedure: AMPUTATION OF TOE WITH METATARSAL RIGHT FOOT HALLUX;  Surgeon: Landis Martins, DPM;  Location: [facility];  Service: Podiatry;  Laterality: Right;  . EYE SURGERY Right 08/22/2016   cataract  . MULTIPLE TOOTH Jorge Mandril, MD 10/28/17 [phone]

## 2017-11-05 ENCOUNTER — Other Ambulatory Visit: Payer: Self-pay

## 2017-11-05 NOTE — Telephone Encounter (Signed)
LVM requesting patient call back to discuss referral for pulmonssry

## 2017-12-04 ENCOUNTER — Other Ambulatory Visit: Payer: Self-pay

## 2017-12-04 ENCOUNTER — Emergency Department (HOSPITAL_BASED_OUTPATIENT_CLINIC_OR_DEPARTMENT_OTHER): Payer: Managed Care, Other (non HMO)

## 2017-12-04 ENCOUNTER — Inpatient Hospital Stay (HOSPITAL_BASED_OUTPATIENT_CLINIC_OR_DEPARTMENT_OTHER)
Admission: EM | Admit: 2017-12-04 | Discharge: 2017-12-09 | DRG: 291 | Disposition: A | Discharge status: Home / Self Care | Payer: Managed Care, Other (non HMO) | Attending: Internal Medicine | Admitting: Internal Medicine

## 2017-12-04 ENCOUNTER — Encounter (HOSPITAL_BASED_OUTPATIENT_CLINIC_OR_DEPARTMENT_OTHER): Payer: Self-pay | Admitting: Emergency Medicine

## 2017-12-04 DIAGNOSIS — E118 Type 2 diabetes mellitus with unspecified complications: Secondary | ICD-10-CM | POA: Diagnosis present

## 2017-12-04 DIAGNOSIS — Z89421 Acquired absence of other right toe(s): Secondary | ICD-10-CM | POA: Diagnosis not present

## 2017-12-04 DIAGNOSIS — N179 Acute kidney failure, unspecified: Secondary | ICD-10-CM | POA: Diagnosis present

## 2017-12-04 DIAGNOSIS — E1165 Type 2 diabetes mellitus with hyperglycemia: Secondary | ICD-10-CM | POA: Diagnosis not present

## 2017-12-04 DIAGNOSIS — J90 Pleural effusion, not elsewhere classified: Secondary | ICD-10-CM

## 2017-12-04 DIAGNOSIS — Z794 Long term (current) use of insulin: Secondary | ICD-10-CM | POA: Diagnosis not present

## 2017-12-04 DIAGNOSIS — I169 Hypertensive crisis, unspecified: Secondary | ICD-10-CM | POA: Insufficient documentation

## 2017-12-04 DIAGNOSIS — I130 Hypertensive heart and chronic kidney disease with heart failure and stage 1 through stage 4 chronic kidney disease, or unspecified chronic kidney disease: Principal | ICD-10-CM | POA: Diagnosis present

## 2017-12-04 DIAGNOSIS — Z79899 Other long term (current) drug therapy: Secondary | ICD-10-CM

## 2017-12-04 DIAGNOSIS — E1169 Type 2 diabetes mellitus with other specified complication: Secondary | ICD-10-CM | POA: Diagnosis present

## 2017-12-04 DIAGNOSIS — E1159 Type 2 diabetes mellitus with other circulatory complications: Secondary | ICD-10-CM | POA: Diagnosis not present

## 2017-12-04 DIAGNOSIS — R609 Edema, unspecified: Secondary | ICD-10-CM

## 2017-12-04 DIAGNOSIS — E1122 Type 2 diabetes mellitus with diabetic chronic kidney disease: Secondary | ICD-10-CM | POA: Diagnosis present

## 2017-12-04 DIAGNOSIS — N183 Chronic kidney disease, stage 3 (moderate): Secondary | ICD-10-CM | POA: Diagnosis present

## 2017-12-04 DIAGNOSIS — R778 Other specified abnormalities of plasma proteins: Secondary | ICD-10-CM

## 2017-12-04 DIAGNOSIS — R7989 Other specified abnormal findings of blood chemistry: Secondary | ICD-10-CM

## 2017-12-04 DIAGNOSIS — R0902 Hypoxemia: Secondary | ICD-10-CM | POA: Diagnosis present

## 2017-12-04 DIAGNOSIS — I248 Other forms of acute ischemic heart disease: Secondary | ICD-10-CM | POA: Diagnosis present

## 2017-12-04 DIAGNOSIS — I10 Essential (primary) hypertension: Secondary | ICD-10-CM | POA: Diagnosis not present

## 2017-12-04 DIAGNOSIS — Z8249 Family history of ischemic heart disease and other diseases of the circulatory system: Secondary | ICD-10-CM | POA: Diagnosis not present

## 2017-12-04 DIAGNOSIS — Z833 Family history of diabetes mellitus: Secondary | ICD-10-CM

## 2017-12-04 DIAGNOSIS — I5031 Acute diastolic (congestive) heart failure: Secondary | ICD-10-CM | POA: Diagnosis present

## 2017-12-04 DIAGNOSIS — E785 Hyperlipidemia, unspecified: Secondary | ICD-10-CM | POA: Diagnosis present

## 2017-12-04 DIAGNOSIS — F419 Anxiety disorder, unspecified: Secondary | ICD-10-CM | POA: Diagnosis present

## 2017-12-04 DIAGNOSIS — Z7982 Long term (current) use of aspirin: Secondary | ICD-10-CM | POA: Diagnosis not present

## 2017-12-04 DIAGNOSIS — R0602 Shortness of breath: Secondary | ICD-10-CM

## 2017-12-04 DIAGNOSIS — E8770 Fluid overload, unspecified: Secondary | ICD-10-CM | POA: Diagnosis present

## 2017-12-04 DIAGNOSIS — I5030 Unspecified diastolic (congestive) heart failure: Secondary | ICD-10-CM | POA: Diagnosis not present

## 2017-12-04 DIAGNOSIS — J810 Acute pulmonary edema: Secondary | ICD-10-CM

## 2017-12-04 DIAGNOSIS — I152 Hypertension secondary to endocrine disorders: Secondary | ICD-10-CM | POA: Diagnosis present

## 2017-12-04 HISTORY — DX: Cardiac murmur, unspecified: R01.1

## 2017-12-04 HISTORY — DX: Type 2 diabetes mellitus without complications: E11.9

## 2017-12-04 LAB — URINALYSIS, ROUTINE W REFLEX MICROSCOPIC
BILIRUBIN URINE: NEGATIVE
GLUCOSE, UA: NEGATIVE mg/dL
Ketones, ur: NEGATIVE mg/dL
Leukocytes, UA: NEGATIVE
Nitrite: NEGATIVE
PH: 6 (ref 5.0–8.0)
Protein, ur: >300 mg/dL — AB
SPECIFIC GRAVITY, URINE: 1.025 (ref 1.005–1.030)

## 2017-12-04 LAB — CBC WITH DIFFERENTIAL/PLATELET
Basophils Absolute: 0.1 10*3/uL (ref 0.0–0.1)
Basophils Relative: 1 %
EOS PCT: 2 %
Eosinophils Absolute: 0.1 10*3/uL (ref 0.0–0.7)
HEMATOCRIT: 33.5 % — AB (ref 39.0–52.0)
Hemoglobin: 11.3 g/dL — ABNORMAL LOW (ref 13.0–17.0)
LYMPHS PCT: 24 %
Lymphs Abs: 1 10*3/uL (ref 0.7–4.0)
MCH: 27.5 pg (ref 26.0–34.0)
MCHC: 33.7 g/dL (ref 30.0–36.0)
MCV: 81.5 fL (ref 78.0–100.0)
MONO ABS: 0.4 10*3/uL (ref 0.1–1.0)
MONOS PCT: 10 %
NEUTROS ABS: 2.6 10*3/uL (ref 1.7–7.7)
Neutrophils Relative %: 63 %
Platelets: 270 10*3/uL (ref 150–400)
RBC: 4.11 MIL/uL — ABNORMAL LOW (ref 4.22–5.81)
RDW: 12.1 % (ref 11.5–15.5)
WBC: 4.1 10*3/uL (ref 4.0–10.5)

## 2017-12-04 LAB — COMPREHENSIVE METABOLIC PANEL
ALBUMIN: 3 g/dL — AB (ref 3.5–5.0)
ALK PHOS: 77 U/L (ref 38–126)
ALT: 21 U/L (ref 0–44)
ANION GAP: 8 (ref 5–15)
AST: 26 U/L (ref 15–41)
BUN: 23 mg/dL — AB (ref 6–20)
CALCIUM: 8.6 mg/dL — AB (ref 8.9–10.3)
CO2: 26 mmol/L (ref 22–32)
Chloride: 105 mmol/L (ref 98–111)
Creatinine, Ser: 1.9 mg/dL — ABNORMAL HIGH (ref 0.61–1.24)
GFR calc Af Amer: 43 mL/min — ABNORMAL LOW (ref >60)
GFR calc non Af Amer: 37 mL/min — ABNORMAL LOW (ref >60)
GLUCOSE: 124 mg/dL — AB (ref 70–99)
POTASSIUM: 3.7 mmol/L (ref 3.5–5.1)
SODIUM: 139 mmol/L (ref 135–145)
Total Bilirubin: 0.9 mg/dL (ref 0.3–1.2)
Total Protein: 6.5 g/dL (ref 6.5–8.1)

## 2017-12-04 LAB — GLUCOSE, CAPILLARY
GLUCOSE-CAPILLARY: 62 mg/dL — AB (ref 70–99)
GLUCOSE-CAPILLARY: 75 mg/dL (ref 70–99)
Glucose-Capillary: 94 mg/dL (ref 70–99)

## 2017-12-04 LAB — URINALYSIS, MICROSCOPIC (REFLEX)

## 2017-12-04 LAB — MRSA PCR SCREENING: MRSA BY PCR: NEGATIVE

## 2017-12-04 LAB — BRAIN NATRIURETIC PEPTIDE: B Natriuretic Peptide: 64.5 pg/mL (ref 0.0–100.0)

## 2017-12-04 LAB — TROPONIN I
TROPONIN I: 0.04 ng/mL — AB (ref <0.03)
Troponin I: 0.06 ng/mL (ref <0.03)

## 2017-12-04 MED ORDER — FUROSEMIDE 10 MG/ML IJ SOLN
20.0000 mg | Freq: Two times a day (BID) | INTRAMUSCULAR | Status: DC
  Administered 2017-12-04 – 2017-12-06 (×4): 20 mg via INTRAVENOUS
  Filled 2017-12-04 (×3): qty 2

## 2017-12-04 MED ORDER — SODIUM CHLORIDE 0.9% FLUSH
3.0000 mL | Freq: Two times a day (BID) | INTRAVENOUS | Status: DC
  Administered 2017-12-04 – 2017-12-09 (×10): 3 mL via INTRAVENOUS

## 2017-12-04 MED ORDER — INSULIN ASPART 100 UNIT/ML ~~LOC~~ SOLN
0.0000 [IU] | Freq: Three times a day (TID) | SUBCUTANEOUS | Status: DC
  Administered 2017-12-05: 1 [IU] via SUBCUTANEOUS
  Administered 2017-12-05 – 2017-12-06 (×4): 2 [IU] via SUBCUTANEOUS

## 2017-12-04 MED ORDER — ACETAMINOPHEN 325 MG PO TABS: 650.0000 mg | ORAL_TABLET | Freq: Four times a day (QID) | ORAL | Status: DC | PRN

## 2017-12-04 MED ORDER — FLUOXETINE HCL 20 MG PO CAPS
20.0000 mg | ORAL_CAPSULE | Freq: Every day | ORAL | Status: DC
  Administered 2017-12-04 – 2017-12-08 (×5): 20 mg via ORAL
  Filled 2017-12-04 (×5): qty 1

## 2017-12-04 MED ORDER — ENSURE ENLIVE PO LIQD
237.0000 mL | Freq: Two times a day (BID) | ORAL | Status: DC
  Administered 2017-12-05: 237 mL via ORAL

## 2017-12-04 MED ORDER — ENOXAPARIN SODIUM 40 MG/0.4ML ~~LOC~~ SOLN
40.0000 mg | SUBCUTANEOUS | Status: DC
  Administered 2017-12-04: 40 mg via SUBCUTANEOUS
  Filled 2017-12-04: qty 0.4

## 2017-12-04 MED ORDER — SIMVASTATIN 40 MG PO TABS
40.0000 mg | ORAL_TABLET | Freq: Every day | ORAL | Status: DC
  Administered 2017-12-04 – 2017-12-08 (×5): 40 mg via ORAL
  Filled 2017-12-04 (×5): qty 1

## 2017-12-04 MED ORDER — FUROSEMIDE 10 MG/ML IJ SOLN
20.0000 mg | Freq: Once | INTRAMUSCULAR | Status: AC
  Administered 2017-12-04: 20 mg via INTRAVENOUS
  Filled 2017-12-04: qty 2

## 2017-12-04 MED ORDER — ACETAMINOPHEN 650 MG RE SUPP: 650.0000 mg | Freq: Four times a day (QID) | RECTAL | Status: DC | PRN

## 2017-12-04 NOTE — H&P (Signed)
History and Physical    Troy Becker FTD:[id number] DOB: May 29, 1960 DOA: 12/04/2017  PCP: Patient, No Pcp Per  Patient coming from: [place] have personally briefly reviewed patient's old medical records in [place]  Chief Complaint: Shortness of breath, leg swelling  HPI: Troy Becker is a 57 y.o. male with medical history significant of hypertension, diabetes, hyperlipidemia presents with progressive shortness of breath with exertion and bilateral lower extremity edema.  Patient states prior to 2 months ago he had no difficulty with activity or exertion, however since then he has had increased shortness of breath when walking up to 100 yards.  He has also had progressive swelling of both of his legs.  He has noticed approximately 21 pound weight gain since 10/22/2017.  He has also noted some increased abdominal swelling.  He says normally he sleeps with his head elevated but recently has had to sleep upright in the chair to allow him to breathe better at night.  His wife noticed he woke from sleep short of breath the other day.  Patient reports fatigue during the day and has to schedule a nap during work.  Due to his progressive symptoms he decided to present to the [facility] emergency department and subsequently transferred to [facility] for management of volume overload and mildly elevated troponin of 0.6.  He denies any chest pain, palpitations, dysuria, abdominal pain, diarrhea, constipation.  ED Course:  Initial vitals BP 158/74, pulse 87, respiratory rate 18, SPO2 94% on room air, temp 98.5F.  Lab work notable for troponin 0.06, BNP 64.5, UA with protein, 6-10 RBCs, BUN 23, creatinine 1.9 (1.56 7 months ago), corrected calcium 9.3.  Patient was noted to desat to 86% on room air by EDP and subsequently placed on supplemental O2.  CXR showed low lung volumes, mild pulmonary edema.  He was given 1 dose of IV Lasix 20 mg.  Review of Systems: As per HPI  otherwise 10 point review of systems negative.    Past Medical History:  Diagnosis Date  . Abnormal EKG 2006   w/ a neg stress test  . Heart murmur   . Hyperlipidemia   . Hypertension   . Osteomyelitis ([facility])    rt big toe  . Type II diabetes mellitus ([place])     Past Surgical History:  Procedure Laterality Date  . AMPUTATION TOE Right 04/08/2016   Procedure: AMPUTATION OF TOE WITH METATARSAL RIGHT FOOT HALLUX;  Surgeon: Landis Martins, DPM;  Location: [facility];  Service: Podiatry;  Laterality: Right;  . CATARACT EXTRACTION W/ INTRAOCULAR LENS  IMPLANT, BILATERAL Bilateral 2018  . MULTIPLE TOOTH EXTRACTIONS       reports that he has never smoked. He has never used smokeless tobacco. He reports that he does not drink alcohol or use drugs.  No Known Allergies  Family History  Problem Relation Age of Onset  . Coronary artery disease Father        CABG 70  . Hypertension Father   . Hypertension Mother   . Hypertension Sister   . Hypertension Brother   . Diabetes Brother        F M  . Colon cancer Neg Hx   . Prostate cancer Neg Hx     Prior to Admission medications   Medication Sig Start Date End Date Taking? Authorizing Provider  aspirin 81 MG tablet Take 81 mg by mouth daily.     Yes [provider]  FLUoxetine (PROZAC) 20 MG capsule Take 1 capsule (20 mg total) by mouth daily. Patient taking differently: Take 20 mg by mouth at bedtime.  10/22/17  Yes Hagler, Aaron Edelman, MD  HYDROCHLOROTHIAZIDE PO Take 1 tablet by mouth at bedtime.   Yes [provider]  insulin degludec (TRESIBA FLEXTOUCH) 100 UNIT/ML SOPN FlexTouch Pen Inject 0.4 mLs (40 Units total) into the skin daily. And pen needles 1/day. 10/22/17  Yes Renato Shin, MD  simvastatin (ZOCOR) 40 MG tablet Take 1 tablet (40 mg total) by mouth at bedtime. 10/22/17  Yes Hagler, Aaron Edelman, MD  amLODipine (NORVASC) 10 MG tablet Take 1 tablet (10 mg total) by mouth daily. Patient not taking: Reported on  12/04/2017 10/22/17   Vanessa Kick, MD  B-D ULTRAFINE III SHORT PEN 31G X 8 MM MISC USE WITH PEN INJECTIONS 10/28/16   Renato Shin, MD  Blood Glucose Monitoring Suppl (ONE TOUCH ULTRA 2) W/DEVICE KIT 1 Device by Does not apply route once. 08/23/11   Renato Shin, MD  docusate sodium (COLACE) 100 MG capsule Take 1 capsule (100 mg total) by mouth 2 (two) times daily. Patient not taking: Reported on 12/04/2017 04/08/16   Landis Martins, DPM  [facility] DELICA LANCETS MISC Use as directed to check blood sugar twice daily dx 250.02 08/23/11   Renato Shin, MD    Physical Exam: Vitals:   12/04/17 1530 12/04/17 1600 12/04/17 1732 12/04/17 2045  BP: (!) 142/71 136/69 (!) 161/77 (!) 163/90  Pulse: 77 71 78 81  Resp: 16 17    Temp:   98 F (36.7 C) (!) 97.3 F (36.3 C)  TempSrc:   Oral Oral  SpO2: 96% 93% 100% 100%  Weight:   109.8 kg   Height:   6' (1.829 m)     Constitutional: NAD, calm, comfortable Vitals:   12/04/17 1530 12/04/17 1600 12/04/17 1732 12/04/17 2045  BP: (!) 142/71 136/69 (!) 161/77 (!) 163/90  Pulse: 77 71 78 81  Resp: 16 17    Temp:   98 F (36.7 C) (!) 97.3 F (36.3 C)  TempSrc:   Oral Oral  SpO2: 96% 93% 100% 100%  Weight:   109.8 kg   Height:   6' (1.829 m)    Eyes: PERRL, lids and conjunctivae normal ENMT: Mucous membranes are moist. Posterior pharynx clear of any exudate or lesions.Normal dentition.  Neck: normal, supple, no masses, no thyromegaly Respiratory: Bibasilar crackles. Normal respiratory effort. No accessory muscle use.  Cardiovascular: Regular rate and rhythm, no murmurs / rubs / gallops.  +2 pitting edema both legs about two thirds of the way up to the knees. Abdomen: no tenderness, no masses palpated. No hepatosplenomegaly. Bowel sounds positive.  Musculoskeletal: no clubbing / cyanosis. No joint deformity upper and lower extremities. Good ROM, no contractures. Normal muscle tone.  Skin: no rashes, lesions, ulcers. No induration Neurologic: CN 2-12  grossly intact. Sensation intact. Strength 5/5 in all 4.  Psychiatric: Normal judgment and insight. Alert and oriented x 3. Normal mood.    Labs on Admission: I have personally reviewed following labs and imaging studies  CBC: Recent Labs  Lab 12/04/17 1107  WBC 4.1  NEUTROABS 2.6  HGB 11.3*  HCT 33.5*  MCV 81.5  PLT 188   Basic Metabolic Panel: Recent Labs  Lab 12/04/17 1107  NA 139  K 3.7  CL 105  CO2 26  GLUCOSE 124*  BUN 23*  CREATININE 1.90*  CALCIUM 8.6*   GFR: Estimated Creatinine Clearance: 53.6  mL/min (A) (by C-G formula based on SCr of 1.9 mg/dL (H)). Liver Function Tests: Recent Labs  Lab 12/04/17 1107  AST 26  ALT 21  ALKPHOS 77  BILITOT 0.9  PROT 6.5  ALBUMIN 3.0*   No results for input(s): LIPASE, AMYLASE in the last 168 hours. No results for input(s): AMMONIA in the last 168 hours. Coagulation Profile: No results for input(s): INR, PROTIME in the last 168 hours. Cardiac Enzymes: Recent Labs  Lab 12/04/17 1107  TROPONINI 0.06*   BNP (last 3 results) No results for input(s): PROBNP in the last 8760 hours. HbA1C: No results for input(s): HGBA1C in the last 72 hours. CBG: Recent Labs  Lab 12/04/17 1744 12/04/17 1823 12/04/17 2041  GLUCAP 62* 94 75   Lipid Profile: No results for input(s): CHOL, HDL, LDLCALC, TRIG, CHOLHDL, LDLDIRECT in the last 72 hours. Thyroid Function Tests: No results for input(s): TSH, T4TOTAL, FREET4, T3FREE, THYROIDAB in the last 72 hours. Anemia Panel: No results for input(s): VITAMINB12, FOLATE, FERRITIN, TIBC, IRON, RETICCTPCT in the last 72 hours. Urine analysis:    Component Value Date/Time   COLORURINE YELLOW 12/04/2017 1249   APPEARANCEUR CLEAR 12/04/2017 1249   LABSPEC 1.025 12/04/2017 1249   PHURINE 6.0 12/04/2017 1249   GLUCOSEU NEGATIVE 12/04/2017 1249   HGBUR MODERATE (A) 12/04/2017 1249   BILIRUBINUR NEGATIVE 12/04/2017 1249   KETONESUR NEGATIVE 12/04/2017 1249   PROTEINUR >300 (A)  12/04/2017 1249   NITRITE NEGATIVE 12/04/2017 1249   LEUKOCYTESUR NEGATIVE 12/04/2017 1249    Radiological Exams on Admission: Dg Chest 2 View  Result Date: 12/04/2017 CLINICAL DATA:  Shortness of breath for 2 weeks. EXAM: CHEST - 2 VIEW COMPARISON:  None. FINDINGS: The cardiac silhouette is partially obscured but is likely borderline to mildly enlarged in size. The lungs are hypoinflated with central pulmonary vascular congestion, peribronchial cuffing, and mildly increased interstitial markings diffusely. Mild patchy opacity in the lung bases most likely represents atelectasis. There are small pleural effusions. No pneumothorax or acute osseous abnormality is identified. IMPRESSION: Low lung volumes with mild pulmonary edema and small pleural effusions. Electronically Signed   By: Logan Bores M.D.   On: 12/04/2017 10:49    EKG: Independently reviewed.  Sinus rhythm, rate 90 bpm, late R wave progression, low voltage  Assessment/Plan Principal Problem:   Volume overload Active Problems:   Diabetes mellitus type 2 with complications ([facility])   Hyperlipidemia associated with type 2 diabetes mellitus ([place])   Anxiety   Hypertension associated with diabetes ([place])   AKI (acute kidney injury) ([place])   Troy Becker is a 57 y.o. male with medical history significant of hypertension, diabetes, hyperlipidemia presents with progressive shortness of breath with exertion and bilateral lower extremity edema.  Volume Overload: Suspect secondary to undiagnosed CHF versus underlying renal disease.  Currently oxygenating well without any respiratory distress. - IV Lasix 20 mg BID - TTE - Monitor renal fxn/K/Mg - Check urine/microalbumin ratio - Consider renal ultrasound if TTE unrevealing - Daily weights, strict I/O  AKI: Creatinine 1.9 compared to 1.56 seven months ago.  Potentially secondary to volume overload.  Monitor with diuresis.  T2DM: Last A1c greater than 15% on  10/22/2017. -SSI  HTN: Blood pressure elevated here.  Holding home HCTZ in setting of AKI and amlodipine in setting of lower extremity edema.  Continue Lasix as above.  Anxiety: Stable.  Continue home fluoxetine.  DVT prophylaxis: Lovenox  Code Status: Full  Family Communication: Wife, brother, sister at bedside Disposition Plan:  Likely DC to home pending work-up Consults called: none Admission status: Inpatient, telemetry    Zada Finders MD [facility] Pager [phone]  If 7PM-7AM, please contact night-coverage [email or web address] Password [email or web address]  12/04/2017, 9:16 PM

## 2017-12-04 NOTE — ED Notes (Signed)
Patient is resting comfortably. 

## 2017-12-04 NOTE — ED Notes (Signed)
ED Provider at bedside. 

## 2017-12-04 NOTE — ED Triage Notes (Addendum)
PT presents with c/o shortness of breath for 2 months.and swollen abdomen. And diaphoretic earlier today. Pt was on airplane a week ago and ankles have been swollen since then. Pt has  20 pound weight gain in the past month. Wife stated he passed out a month ago and never got checked out . Wife states he had a doctors appointment a week ago and never went.

## 2017-12-04 NOTE — ED Provider Notes (Signed)
[facility] EMERGENCY DEPARTMENT Provider Note   CSN: [id number] Arrival date & time: 12/04/17  4098     History   Chief Complaint Chief Complaint  Patient presents with  . Shortness of Breath    HPI Troy Becker is a 57 y.o. male.  The history is provided by the patient, the spouse and medical records. No language interpreter was used.  Shortness of Breath  This is a new problem. The average episode lasts 1 month. The problem occurs continuously.The current episode started more than 1 week ago. The problem has been gradually worsening. Associated symptoms include leg swelling. Pertinent negatives include no fever, no headaches, no rhinorrhea, no neck pain, no cough, no sputum production, no hemoptysis, no wheezing, no chest pain, no vomiting, no abdominal pain, no rash and no leg pain. He has tried nothing for the symptoms. The treatment provided no relief.    Past Medical History:  Diagnosis Date  . Abnormal EKG 2006   w/ a neg stress test  . Diabetes mellitus   . Hyperlipidemia   . Hypertension   . Osteomyelitis ([place])    rt big toe    Patient Active Problem List   Diagnosis Date Noted  . Foot ulcer ([place]) 06/02/2017  . Poor compliance 05/07/2017  . PCP NOTES >>>>>>>>>>>>>>>>>> 10/23/2015  . Onychomycosis ? 06/03/2013  . Personal history of adenomatous colonic polyps 10/10/2011  . Annual physical exam 08/07/2011  . Encounter for long-term (current) use of other medications 08/05/2011  . Anxiety 07/21/2009  . ERECTILE DYSFUNCTION 11/05/2007  . Diabetes mellitus type 2 with complications ([place]) 11/91/4782  . Hyperlipidemia associated with type 2 diabetes mellitus ([place]) 10/23/2006  . ELECTROCARDIOGRAM, ABNORMAL 10/23/2006  . Essential hypertension 07/23/2006    Past Surgical History:  Procedure Laterality Date  . AMPUTATION TOE Right 04/08/2016   Procedure: AMPUTATION OF TOE WITH METATARSAL RIGHT FOOT HALLUX;  Surgeon: Landis Martins, DPM;  Location:  [place];  Service: Podiatry;  Laterality: Right;  . EYE SURGERY Right 08/22/2016   cataract  . MULTIPLE TOOTH EXTRACTIONS          Home Medications    Prior to Admission medications   Medication Sig Start Date End Date Taking? Authorizing Provider  amLODipine (NORVASC) 10 MG tablet Take 1 tablet (10 mg total) by mouth daily. 10/22/17   Vanessa Kick, MD  aspirin 81 MG tablet Take 81 mg by mouth daily.      [provider]  B-D ULTRAFINE III SHORT PEN 31G X 8 MM MISC USE WITH PEN INJECTIONS 10/28/16   Renato Shin, MD  Blood Glucose Monitoring Suppl (ONE TOUCH ULTRA 2) W/DEVICE KIT 1 Device by Does not apply route once. 08/23/11   Renato Shin, MD  docusate sodium (COLACE) 100 MG capsule Take 1 capsule (100 mg total) by mouth 2 (two) times daily. 04/08/16   Landis Martins, DPM  FLUoxetine (PROZAC) 20 MG capsule Take 1 capsule (20 mg total) by mouth daily. 10/22/17   Vanessa Kick, MD  insulin degludec (TRESIBA FLEXTOUCH) 100 UNIT/ML SOPN FlexTouch Pen Inject 0.4 mLs (40 Units total) into the skin daily. And pen needles 1/day. 10/22/17   Renato Shin, MD  [facility] LANCETS MISC Use as directed to check blood sugar twice daily dx 250.02 08/23/11   Renato Shin, MD  simvastatin (ZOCOR) 40 MG tablet Take 1 tablet (40 mg total) by mouth at bedtime. 10/22/17   Vanessa Kick, MD    Family History Family History  Problem Relation Age of Onset  . Coronary artery disease Father        CABG 52  . Hypertension Father   . Hypertension Mother   . Hypertension Sister   . Hypertension Brother   . Diabetes Brother        F M  . Colon cancer Neg Hx   . Prostate cancer Neg Hx     Social History Social History   Tobacco Use  . Smoking status: Never Smoker  . Smokeless tobacco: Never Used  Substance Use Topics  . Alcohol use: No  . Drug use: No     Allergies   Patient has no known allergies.   Review of Systems Review of Systems  Constitutional:  Positive for fatigue. Negative for chills, diaphoresis and fever.  HENT: Negative for congestion and rhinorrhea.   Respiratory: Positive for shortness of breath. Negative for cough, hemoptysis, sputum production, chest tightness, wheezing and stridor.   Cardiovascular: Positive for leg swelling. Negative for chest pain.  Gastrointestinal: Negative for abdominal pain, constipation, diarrhea, nausea and vomiting.  Genitourinary: Positive for decreased urine volume. Negative for flank pain.  Musculoskeletal: Negative for back pain, neck pain and neck stiffness.  Skin: Negative for rash.  Neurological: Negative for weakness, light-headedness and headaches.  Psychiatric/Behavioral: Negative for agitation.  All other systems reviewed and are negative.    Physical Exam Updated Vital Signs BP (!) 158/74 (BP Location: Left Arm)   Pulse 90   Temp 98.6 F (37 C) (Oral)   Resp (!) 25   Ht 6' (1.829 m)   Wt 111.4 kg   SpO2 91%   BMI 33.30 kg/m   Physical Exam  Constitutional: He appears well-developed and well-nourished.  Non-toxic appearance. He does not appear ill. No distress.  HENT:  Head: Normocephalic and atraumatic.  Mouth/Throat: Oropharynx is clear and moist.  Eyes: Pupils are equal, round, and reactive to light. Conjunctivae are normal.  Neck: Normal range of motion. Neck supple.  Cardiovascular: Normal rate and regular rhythm.  No murmur heard. Pulmonary/Chest: Effort normal. Tachypnea noted. No respiratory distress. He has decreased breath sounds. He has no wheezes. He has no rhonchi. He has rales.  Abdominal: Soft. There is no tenderness.  Musculoskeletal:       Right lower leg: He exhibits edema.       Left lower leg: He exhibits edema.  Neurological: He is alert.  Skin: Skin is warm and dry. No erythema.  Psychiatric: He has a normal mood and affect.  Nursing note and vitals reviewed.    ED Treatments / Results  Labs (all labs ordered are listed, but only abnormal  results are displayed) Labs Reviewed  TROPONIN I - Abnormal; Notable for the following components:      Result Value   Troponin I 0.06 (*)    All other components within normal limits  COMPREHENSIVE METABOLIC PANEL - Abnormal; Notable for the following components:   Glucose, Bld 124 (*)    BUN 23 (*)    Creatinine, Ser 1.90 (*)    Calcium 8.6 (*)    Albumin 3.0 (*)    GFR calc non Af Amer 37 (*)    GFR calc Af Amer 43 (*)    All other components within normal limits  CBC WITH DIFFERENTIAL/PLATELET - Abnormal; Notable for the following components:   RBC 4.11 (*)    Hemoglobin 11.3 (*)    HCT 33.5 (*)    All other components within normal limits  URINALYSIS, ROUTINE W REFLEX MICROSCOPIC - Abnormal; Notable for the following components:   Hgb urine dipstick MODERATE (*)    Protein, ur >300 (*)    All other components within normal limits  URINALYSIS, MICROSCOPIC (REFLEX) - Abnormal; Notable for the following components:   Bacteria, UA MANY (*)    All other components within normal limits  URINE CULTURE  BRAIN NATRIURETIC PEPTIDE    EKG EKG Interpretation  Date/Time:  Thursday December 04 2017 10:25:10 EDT Ventricular Rate:  90 PR Interval:    QRS Duration: 109 QT Interval:  400 QTC Calculation: 490 R Axis:   94 Text Interpretation:  Sinus rhythm Consider left atrial enlargement Low voltage with right axis deviation Nonspecific T abnormalities, lateral leads Borderline prolonged QT interval When compared to prior,  longer QTc.  no STEMI Confirmed by Antony Blackbird [phone]) on 12/04/2017 10:27:46 AM   Radiology Dg Chest 2 View  Result Date: 12/04/2017 CLINICAL DATA:  Shortness of breath for 2 weeks. EXAM: CHEST - 2 VIEW COMPARISON:  None. FINDINGS: The cardiac silhouette is partially obscured but is likely borderline to mildly enlarged in size. The lungs are hypoinflated with central pulmonary vascular congestion, peribronchial cuffing, and mildly increased interstitial  markings diffusely. Mild patchy opacity in the lung bases most likely represents atelectasis. There are small pleural effusions. No pneumothorax or acute osseous abnormality is identified. IMPRESSION: Low lung volumes with mild pulmonary edema and small pleural effusions. Electronically Signed   By: Logan Bores M.D.   On: 12/04/2017 10:49    Procedures Procedures (including critical care time)  CRITICAL CARE Performed by: Gwenyth Allegra Maicee Ullman Total critical care time: 35 minutes Critical care time was exclusive of separately billable procedures and treating other patients. Critical care was necessary to treat or prevent imminent or life-threatening deterioration. Critical care was time spent personally by me on the following activities: development of treatment plan with patient and/or surrogate as well as nursing, discussions with consultants, evaluation of patient's response to treatment, examination of patient, obtaining history from patient or surrogate, ordering and performing treatments and interventions, ordering and review of laboratory studies, ordering and review of radiographic studies, pulse oximetry and re-evaluation of patient's condition.   Medications Ordered in ED Medications  furosemide (LASIX) injection 20 mg (20 mg Intravenous Given 12/04/17 1402)     Initial Impression / Assessment and Plan / ED Course  I have reviewed the triage vital signs and the nursing notes.  Pertinent labs & imaging results that were available during my care of the patient were reviewed by me and considered in my medical decision making (see chart for details).     KIAH KEAY is a 57 y.o. male with a past medical history significant for hypertension, hyperlipidemia, and diabetes who presents with exertional shortness of breath, fatigue, bilateral lower extremity swelling, and decreased urination.  Patient reports that for the last few months he has had gradually worsening symptoms.  He  says over the last few weeks it has progressed.  He says that he has had 20 pound weight gain in the last month.  He says his legs are now both very swollen but have minimal pain.  He reports that he has felt more tired and has exertional shortness of breath with any ambulation.  When patient was speaking to me taking history, his oxygen saturations dropped to 86 on room air.  Patient subsequently placed on 2 L nasal cannula.  Patient reports his abdomen seems more swollen but denies  any abdominal pain.  He denies any chest pain or fevers, chills, or cough.  He has no history of heart failure to his knowledge.  He says that he is never been on a fluid pill.  He denies any cuts patient or diarrhea but reports his urine is decreased in amount over the last few weeks.  He denies recent injuries or medication changes.  He also reports he had syncopal episode several months ago but has not had any recurrent episodes.  On exam, patient has crackles in the bases of the lungs.  Decreased breath sounds at the bases as well.  No murmur.  Abdomen nontender.  Chest nontender.  Patient has pitting edema in both legs going up past his knees.  Normal sensation and pulse palpated.  Patient has no focal neurologic deficits.  No scleral icterus.  No stridor.  Patient's EKG shows no STEMI.  QT slightly more prolonged than prior.  Clinically I am concerned about new heart failure and fluid overload.  Patient will have laboratory testing to further investigate as well as a chest x-ray.  Urinalysis will be obtained to look for UTI or protein spilling.  Next  Chest x-ray shows pulmonary edema and pleural effusions.  Suspect this is the etiology of his hypoxia.  Labs do not show evidence of urinary tract infection.   Patient is has creatinine that is more elevated than prior.  BNP surprisingly not elevated at 64.5.  Suspect this may be artificially low.  CBC shows no cytosis and mild anemia.  Troponin was elevated at 0.06.   This will be trended.  Urinalysis showed hemoglobin and protein but no evidence of infection.  Even his work-up I am concerned and with his new oxygen requirement, feel patient needs admission.  Patient will need admission for his exertional shortness of breath with positive troponin as well as the concern for fluid overload despite his low BNP.  Due to the concern for positive troponin, patient will be admitted to [facility] for further management.   Final Clinical Impressions(s) / ED Diagnoses   Final diagnoses:  Exertional shortness of breath  Hypoxia  Elevated troponin  Edema, unspecified type  Hypervolemia, unspecified hypervolemia type  Chronic bilateral pleural effusions  Acute pulmonary edema [facility])    ED Discharge Orders    None      Clinical Impression: 1. Exertional shortness of breath   2. Hypoxia   3. Elevated troponin   4. Edema, unspecified type   5. Hypervolemia, unspecified hypervolemia type   6. Chronic bilateral pleural effusions   7. Acute pulmonary edema (HCC)     Disposition: Admit  This note was prepared with assistance of Dragon voice recognition software. Occasional wrong-word or sound-a-like substitutions may have occurred due to the inherent limitations of voice recognition software.     Modesta Sammons, Gwenyth Allegra, MD 12/04/17 1730

## 2017-12-04 NOTE — ED Notes (Signed)
Patient transported to X-ray 

## 2017-12-04 NOTE — Progress Notes (Signed)
Called from Dr. Sherry Ruffing at [facility] regarding transfer of this patient.  He is a 57yo with h/o DM; HTN; and HLD presenting with exertional SOB.   Concern for new-onset CHF vs. Intrinsic renal issue.  Weight gain 20 pounds in a month.  Resumed outpatient meds x 2 months after having been off them.  +fatigue, no CP.  Severe edema.  CXR with pulmonary edema and pleural effusions.  Decreased to 86% on RA with conversation.  Troponin 0.06.  BNP is normal.  UA with >300 protein.  Creatinine minimally elevated.  Needs to come in for hypoxia, edema, volume overload.  I recommend giving Lasix 20 mg IV.  Will admit to telemetry.  Carlyon Shadow, M.D.

## 2017-12-04 NOTE — ED Notes (Signed)
Report given to [facility] with [employer] 

## 2017-12-04 NOTE — ED Notes (Signed)
Report given to Lurline Hare, receiving nurse [facility]

## 2017-12-04 NOTE — ED Notes (Signed)
Reminded of needing U/A, declines at present

## 2017-12-05 ENCOUNTER — Inpatient Hospital Stay (HOSPITAL_COMMUNITY): Payer: Managed Care, Other (non HMO)

## 2017-12-05 DIAGNOSIS — I5030 Unspecified diastolic (congestive) heart failure: Secondary | ICD-10-CM

## 2017-12-05 DIAGNOSIS — E118 Type 2 diabetes mellitus with unspecified complications: Secondary | ICD-10-CM

## 2017-12-05 LAB — GLUCOSE, CAPILLARY
GLUCOSE-CAPILLARY: 182 mg/dL — AB (ref 70–99)
Glucose-Capillary: 79 mg/dL (ref 70–99)
Glucose-Capillary: 139 mg/dL — ABNORMAL HIGH (ref 70–99)
Glucose-Capillary: 225 mg/dL — ABNORMAL HIGH (ref 70–99)

## 2017-12-05 LAB — CBC
HCT: 35.5 % — ABNORMAL LOW (ref 39.0–52.0)
HEMOGLOBIN: 11.5 g/dL — AB (ref 13.0–17.0)
MCH: 27.4 pg (ref 26.0–34.0)
MCHC: 32.4 g/dL (ref 30.0–36.0)
MCV: 84.7 fL (ref 78.0–100.0)
PLATELETS: 261 10*3/uL (ref 150–400)
RBC: 4.19 MIL/uL — AB (ref 4.22–5.81)
RDW: 12.4 % (ref 11.5–15.5)
WBC: 5.1 10*3/uL (ref 4.0–10.5)

## 2017-12-05 LAB — ECHOCARDIOGRAM COMPLETE
Height: 72 in
WEIGHTICAEL: 3872 [oz_av]

## 2017-12-05 LAB — TROPONIN I: TROPONIN I: 0.04 ng/mL — AB (ref <0.03)

## 2017-12-05 LAB — BASIC METABOLIC PANEL
ANION GAP: 9 (ref 5–15)
BUN: 19 mg/dL (ref 6–20)
CALCIUM: 8.5 mg/dL — AB (ref 8.9–10.3)
CHLORIDE: 104 mmol/L (ref 98–111)
CO2: 26 mmol/L (ref 22–32)
Creatinine, Ser: 1.93 mg/dL — ABNORMAL HIGH (ref 0.61–1.24)
GFR calc non Af Amer: 36 mL/min — ABNORMAL LOW (ref >60)
GFR, EST AFRICAN AMERICAN: 42 mL/min — AB (ref >60)
Glucose, Bld: 139 mg/dL — ABNORMAL HIGH (ref 70–99)
Potassium: 3.6 mmol/L (ref 3.5–5.1)
SODIUM: 139 mmol/L (ref 135–145)

## 2017-12-05 LAB — HIV ANTIBODY (ROUTINE TESTING W REFLEX): HIV Screen 4th Generation wRfx: NONREACTIVE

## 2017-12-05 LAB — MAGNESIUM: MAGNESIUM: 1.9 mg/dL (ref 1.7–2.4)

## 2017-12-05 LAB — LIPID PANEL
CHOLESTEROL: 145 mg/dL (ref 0–200)
HDL: 54 mg/dL (ref >40)
LDL CALC: 70 mg/dL (ref 0–99)
Total CHOL/HDL Ratio: 2.7 RATIO
Triglycerides: 103 mg/dL (ref <150)
VLDL: 21 mg/dL (ref 0–40)

## 2017-12-05 MED ORDER — HEPARIN SODIUM (PORCINE) 5000 UNIT/ML IJ SOLN
5000.0000 [IU] | Freq: Three times a day (TID) | INTRAMUSCULAR | Status: DC
  Administered 2017-12-06 – 2017-12-09 (×10): 5000 [IU] via SUBCUTANEOUS
  Filled 2017-12-05 (×10): qty 1

## 2017-12-05 MED ORDER — HYDRALAZINE HCL 20 MG/ML IJ SOLN: 10.0000 mg | Freq: Four times a day (QID) | INTRAMUSCULAR | Status: DC | PRN

## 2017-12-05 MED ORDER — HYDRALAZINE HCL 25 MG PO TABS
25.0000 mg | ORAL_TABLET | Freq: Three times a day (TID) | ORAL | Status: DC
  Administered 2017-12-05 – 2017-12-07 (×6): 25 mg via ORAL
  Filled 2017-12-05 (×6): qty 1

## 2017-12-05 MED ORDER — AMLODIPINE BESYLATE 10 MG PO TABS
10.0000 mg | ORAL_TABLET | Freq: Every day | ORAL | Status: DC
  Administered 2017-12-05 – 2017-12-09 (×5): 10 mg via ORAL
  Filled 2017-12-05 (×5): qty 1

## 2017-12-05 MED ORDER — HEPARIN SODIUM (PORCINE) 5000 UNIT/ML IJ SOLN: 5000.0000 [IU] | Freq: Three times a day (TID) | INTRAMUSCULAR | Status: DC

## 2017-12-05 NOTE — Progress Notes (Signed)
Nutrition Brief Note  Patient identified on the Malnutrition Screening Tool (MST) Report.   Patient reports recent weight changes related to fluid status. He has had a good appetite and has been eating very well.   Nutrition focused physical exam completed.  No muscle or subcutaneous fat depletion noticed.   Wt Readings from Last 15 Encounters:  12/04/17 109.8 kg  10/22/17 100.4 kg  06/02/17 100.1 kg  05/06/17 100.3 kg  11/01/16 106.5 kg  09/20/16 103.9 kg  09/13/16 101.7 kg  04/08/16 108.9 kg  03/06/16 108.1 kg  10/23/15 109 kg  03/03/15 110.6 kg  02/23/15 110.2 kg  04/08/14 111.6 kg  02/18/14 111.4 kg  11/05/13 109 kg    Body mass index is 32.82 kg/m. Patient meets criteria for obesity based on current BMI.   Current diet order is heart healthy CHO modified, patient is consuming approximately 75-100% of meals at this time. Labs and medications reviewed.   Patient requested diet information regarding diet to follow at home. RD provided heart healthy carbohydrate consistent handout from the Academy of [employer]. Reviewed ways to decrease sodium intake and high sodium foods to limit or avoid in his diet. Provided alternative seasonings handout as well as heart healthy cooking and shopping handouts. Patient was very receptive to education. Expect good compliance.   No nutrition interventions warranted at this time. If nutrition issues arise, please consult RD.   Molli Barrows, RD, LDN, [place] Pager [phone] After Hours Pager [phone]

## 2017-12-05 NOTE — Progress Notes (Signed)
  Echocardiogram 2D Echocardiogram has been performed.  Merrie Roof F 12/05/2017, 11:35 AM

## 2017-12-05 NOTE — Progress Notes (Signed)
PROGRESS NOTE  Troy Becker XTK:[id number] DOB: 08-14-1960 DOA: 12/04/2017 PCP: Patient, No Pcp Per  HPI/Recap of past 24 hours: HPI from Dr Acquanetta Chain is a 57 y.o. male with medical history significant of hypertension, diabetes, hyperlipidemia presents with progressive shortness of breath with exertion, BLE edema, weight gain (about 21 pounds) and fatigue for the past couple of weeks. In the ED, pt was noted to have trop 0.06, BNP 64.5, UA with protein, creatinine 1.9 (1.56 on 05/2017). Patient was noted to desat to 86% on room air by EDP and subsequently placed on supplemental O2.  CXR showed low lung volumes, mild pulmonary edema. Pt admitted for further management.   Today, pt reported feeling better, denies any worsening shortness of breath, chest pain, abdominal pain, dysuria, fever/chills.  Assessment/Plan: Principal Problem:   Volume overload Active Problems:   Diabetes mellitus type 2 with complications (HCC)   Hyperlipidemia associated with type 2 diabetes mellitus (HCC)   Anxiety   Hypertension associated with diabetes ([place])   AKI (acute kidney injury) ([place])   Acute pulmonary edema, ??CHF BNP 64.5 Chest x-ray with mild pulmonary edema Echo pending report Continue IV Lasix Strict I's and O's Monitor on telemetry  AKI on CKD stage III Creatinine baseline around 1.5, on admission 1.9 ?Renal vascular congestion versus worsening CKD Avoid nephrotoxic's, continue Lasix for now Daily BMP  Elevated troponin Flat trend, EKG with sinus rhythm Likely due to demand ischemia, from pulmonary edema Echo pending  Diabetes mellitus type 2 Uncontrolled, last A1c on 10/2017 >15% SSI, Accu-Cheks Hold home Tyler Aas PCP to follow-up with a repeat A1c  Hypertension Stable Continue home amlodipine Hold home hydrochlorothiazide due to AKI  Hyperlipidemia LDL 70 Continue statins  Anxiety Continue home fluoxetine    Code Status: Full  Family  Communication: None at bedside  Disposition Plan: Once work-up complete   Consultants:  None  Procedures:  None  Antimicrobials:  None  DVT prophylaxis: Heparin   Objective: Vitals:   12/04/17 1732 12/04/17 2045 12/04/17 2226 12/05/17 0503  BP: (!) 161/77 (!) 163/90  (!) 143/60  Pulse: 78 81 84 66  Resp:      Temp: 98 F (36.7 C) (!) 97.3 F (36.3 C)  98.6 F (37 C)  TempSrc: Oral Oral  Oral  SpO2: 100% 100% 98% 95%  Weight: 109.8 kg     Height: 6' (1.829 m)       Intake/Output Summary (Last 24 hours) at 12/05/2017 1158 Last data filed at 12/05/2017 0829 Gross per 24 hour  Intake 585 ml  Output 1600 ml  Net -1015 ml   Filed Weights   12/04/17 0954 12/04/17 1732  Weight: 111.4 kg 109.8 kg    Exam:   General: NAD  Cardiovascular: S1, S2 present  Respiratory: Mild bibasilar crackles noted  Abdomen: Soft, nontender, nondistended, bowel sounds present  Musculoskeletal: 2+ pitting edema bilaterally  Skin: Normal  Psychiatry: Normal mood   Data Reviewed: CBC: Recent Labs  Lab 12/04/17 1107 12/05/17 0220  WBC 4.1 5.1  NEUTROABS 2.6  --   HGB 11.3* 11.5*  HCT 33.5* 35.5*  MCV 81.5 84.7  PLT 270 992   Basic Metabolic Panel: Recent Labs  Lab 12/04/17 1107 12/05/17 0220  NA 139 139  K 3.7 3.6  CL 105 104  CO2 26 26  GLUCOSE 124* 139*  BUN 23* 19  CREATININE 1.90* 1.93*  CALCIUM 8.6* 8.5*  MG  --  1.9   GFR: Estimated Creatinine  Clearance: 52.8 mL/min (A) (by C-G formula based on SCr of 1.93 mg/dL (H)). Liver Function Tests: Recent Labs  Lab 12/04/17 1107  AST 26  ALT 21  ALKPHOS 77  BILITOT 0.9  PROT 6.5  ALBUMIN 3.0*   No results for input(s): LIPASE, AMYLASE in the last 168 hours. No results for input(s): AMMONIA in the last 168 hours. Coagulation Profile: No results for input(s): INR, PROTIME in the last 168 hours. Cardiac Enzymes: Recent Labs  Lab 12/04/17 1107 12/04/17 2003 12/05/17 0220  TROPONINI 0.06*  0.04* 0.04*   BNP (last 3 results) No results for input(s): PROBNP in the last 8760 hours. HbA1C: No results for input(s): HGBA1C in the last 72 hours. CBG: Recent Labs  Lab 12/04/17 1744 12/04/17 1823 12/04/17 2041 12/05/17 0724  GLUCAP 62* 94 75 79   Lipid Profile: Recent Labs    12/05/17 0220  CHOL 145  HDL 54  LDLCALC 70  TRIG 103  CHOLHDL 2.7   Thyroid Function Tests: No results for input(s): TSH, T4TOTAL, FREET4, T3FREE, THYROIDAB in the last 72 hours. Anemia Panel: No results for input(s): VITAMINB12, FOLATE, FERRITIN, TIBC, IRON, RETICCTPCT in the last 72 hours. Urine analysis:    Component Value Date/Time   COLORURINE YELLOW 12/04/2017 1249   APPEARANCEUR CLEAR 12/04/2017 1249   LABSPEC 1.025 12/04/2017 1249   PHURINE 6.0 12/04/2017 1249   GLUCOSEU NEGATIVE 12/04/2017 1249   HGBUR MODERATE (A) 12/04/2017 1249   BILIRUBINUR NEGATIVE 12/04/2017 1249   KETONESUR NEGATIVE 12/04/2017 1249   PROTEINUR >300 (A) 12/04/2017 1249   NITRITE NEGATIVE 12/04/2017 1249   LEUKOCYTESUR NEGATIVE 12/04/2017 1249   Sepsis Labs: @LABRCNTIP (procalcitonin:4,lacticidven:4)  ) Recent Results (from the past 240 hour(s))  MRSA PCR Screening     Status: None   Collection Time: 12/04/17  6:09 PM  Result Value Ref Range Status   MRSA by PCR NEGATIVE NEGATIVE Final    Comment:        The GeneXpert MRSA Assay (FDA approved for NASAL specimens only), is one component of a comprehensive MRSA colonization surveillance program. It is not intended to diagnose MRSA infection nor to guide or monitor treatment for MRSA infections. Performed at [facility], [place] [address], [place], [place] [address]       Studies: No results found.  Scheduled Meds: . enoxaparin (LOVENOX) injection  40 mg Subcutaneous Q24H  . feeding supplement (ENSURE ENLIVE)  237 mL Oral BID BM  . FLUoxetine  20 mg Oral QHS  . furosemide  20 mg Intravenous Q12H  . insulin aspart  0-9 Units  Subcutaneous TID WC  . simvastatin  40 mg Oral QHS  . sodium chloride flush  3 mL Intravenous Q12H    Continuous Infusions:   LOS: 1 day     Alma Friendly, MD [facility]   If 7PM-7AM, please contact night-coverage [email or web address] 12/05/2017, 11:58 AM

## 2017-12-06 DIAGNOSIS — I5031 Acute diastolic (congestive) heart failure: Secondary | ICD-10-CM

## 2017-12-06 DIAGNOSIS — N183 Chronic kidney disease, stage 3 (moderate): Secondary | ICD-10-CM

## 2017-12-06 LAB — RENAL FUNCTION PANEL
ALBUMIN: 2.8 g/dL — AB (ref 3.5–5.0)
ANION GAP: 9 (ref 5–15)
BUN: 23 mg/dL — ABNORMAL HIGH (ref 6–20)
CHLORIDE: 101 mmol/L (ref 98–111)
CO2: 28 mmol/L (ref 22–32)
Calcium: 8.6 mg/dL — ABNORMAL LOW (ref 8.9–10.3)
Creatinine, Ser: 2.09 mg/dL — ABNORMAL HIGH (ref 0.61–1.24)
GFR, EST AFRICAN AMERICAN: 38 mL/min — AB (ref >60)
GFR, EST NON AFRICAN AMERICAN: 33 mL/min — AB (ref >60)
Glucose, Bld: 187 mg/dL — ABNORMAL HIGH (ref 70–99)
PHOSPHORUS: 3.8 mg/dL (ref 2.5–4.6)
POTASSIUM: 3.9 mmol/L (ref 3.5–5.1)
Sodium: 138 mmol/L (ref 135–145)

## 2017-12-06 LAB — CBC WITH DIFFERENTIAL/PLATELET
Abs Immature Granulocytes: 0 10*3/uL (ref 0.0–0.1)
Basophils Absolute: 0 10*3/uL (ref 0.0–0.1)
Basophils Relative: 1 %
Eosinophils Absolute: 0.1 10*3/uL (ref 0.0–0.7)
Eosinophils Relative: 4 %
HEMATOCRIT: 35.3 % — AB (ref 39.0–52.0)
HEMOGLOBIN: 11.4 g/dL — AB (ref 13.0–17.0)
IMMATURE GRANULOCYTES: 0 %
LYMPHS ABS: 1 10*3/uL (ref 0.7–4.0)
LYMPHS PCT: 29 %
MCH: 27.4 pg (ref 26.0–34.0)
MCHC: 32.3 g/dL (ref 30.0–36.0)
MCV: 84.9 fL (ref 78.0–100.0)
MONOS PCT: 12 %
Monocytes Absolute: 0.4 10*3/uL (ref 0.1–1.0)
Neutro Abs: 2 10*3/uL (ref 1.7–7.7)
Neutrophils Relative %: 54 %
Platelets: 251 10*3/uL (ref 150–400)
RBC: 4.16 MIL/uL — AB (ref 4.22–5.81)
RDW: 12.1 % (ref 11.5–15.5)
WBC: 3.6 10*3/uL — AB (ref 4.0–10.5)

## 2017-12-06 LAB — SODIUM, URINE, RANDOM: SODIUM UR: 121 mmol/L

## 2017-12-06 LAB — URINE CULTURE: CULTURE: NO GROWTH

## 2017-12-06 LAB — GLUCOSE, CAPILLARY
GLUCOSE-CAPILLARY: 183 mg/dL — AB (ref 70–99)
GLUCOSE-CAPILLARY: 161 mg/dL — AB (ref 70–99)
GLUCOSE-CAPILLARY: 122 mg/dL — AB (ref 70–99)
Glucose-Capillary: 122 mg/dL — ABNORMAL HIGH (ref 70–99)
Glucose-Capillary: 98 mg/dL (ref 70–99)

## 2017-12-06 LAB — MICROALBUMIN / CREATININE URINE RATIO
Creatinine, Urine: 87.9 mg/dL
MICROALB UR: 2387.7 ug/mL — AB
Microalb Creat Ratio: 2716.4 mg/g creat — ABNORMAL HIGH (ref 0.0–30.0)

## 2017-12-06 LAB — CREATININE, URINE, RANDOM: Creatinine, Urine: 37.82 mg/dL

## 2017-12-06 MED ORDER — INSULIN ASPART 100 UNIT/ML ~~LOC~~ SOLN
0.0000 [IU] | Freq: Every day | SUBCUTANEOUS | Status: DC
  Administered 2017-12-08: 2 [IU] via SUBCUTANEOUS

## 2017-12-06 MED ORDER — FUROSEMIDE 10 MG/ML IJ SOLN
40.0000 mg | Freq: Two times a day (BID) | INTRAMUSCULAR | Status: DC
  Administered 2017-12-06 – 2017-12-09 (×7): 40 mg via INTRAVENOUS
  Filled 2017-12-06 (×6): qty 4

## 2017-12-06 MED ORDER — INSULIN ASPART 100 UNIT/ML ~~LOC~~ SOLN
0.0000 [IU] | Freq: Three times a day (TID) | SUBCUTANEOUS | Status: DC
  Administered 2017-12-07 (×2): 5 [IU] via SUBCUTANEOUS
  Administered 2017-12-08: 2 [IU] via SUBCUTANEOUS
  Administered 2017-12-08 – 2017-12-09 (×3): 3 [IU] via SUBCUTANEOUS

## 2017-12-06 NOTE — Progress Notes (Signed)
PROGRESS NOTE  BRONCO MCGRORY QQI:[id number] DOB: April 11, 1960 DOA: 12/04/2017 PCP: Patient, No Pcp Per  HPI/Recap of past 24 hours: HPI from Dr Acquanetta Chain is a 57 y.o. male with medical history significant of hypertension, diabetes, hyperlipidemia presents with progressive shortness of breath with exertion, BLE edema, weight gain (about 21 pounds) and fatigue for the past couple of weeks. In the ED, pt was noted to have trop 0.06, BNP 64.5, UA with protein, creatinine 1.9 (1.56 on 05/2017). Patient was noted to desat to 86% on room air by EDP and subsequently placed on supplemental O2.  CXR showed low lung volumes, mild pulmonary edema. Pt admitted for further management.   Today, pt reported feeling better, denies any new complaints. Had a lengthy discussion about medication compliance and its importance. Educated pt on diabetes and its complications.  Assessment/Plan: Principal Problem:   Volume overload Active Problems:   Diabetes mellitus type 2 with complications (HCC)   Hyperlipidemia associated with type 2 diabetes mellitus (HCC)   Anxiety   Hypertension associated with diabetes ([place])   AKI (acute kidney injury) ([place])   Acute diastolic HF BNP 94.1, 2+ BLE edema Chest x-ray with mild pulmonary edema Echo shows EF of 65-70%, Grade 1DD, severe LVH Increased IV Lasix to 40 mg BID for further diuresis Strict I's and O's, daily weight Daily BMP  AKI on CKD stage III Creatinine baseline around 1.5, on admission 1.9 ?Cardiorenal Avoid nephrotoxic's, increased Lasix for now (discussed with Nephrology), if significant rise in Cr, will reduce lasix dose Daily BMP  Elevated troponin Flat trend, EKG with sinus rhythm Likely due to demand ischemia, from pulmonary edema Echo as above  Diabetes mellitus type 2 Uncontrolled, last A1c on 10/2017 >15% SSI, Accu-Cheks Hold home Tyler Aas PCP to follow-up with a repeat A1c  Hypertension Continue home amlodipine, added  hydralazine Hold home hydrochlorothiazide due to AKI  Hyperlipidemia LDL 70 Continue statins  Anxiety Continue home fluoxetine    Code Status: Full  Family Communication: None at bedside  Disposition Plan: Once work-up complete, plan for 12/07/17   Consultants:  Spoke to Nephrology  Procedures:  None  Antimicrobials:  None  DVT prophylaxis: Heparin   Objective: Vitals:   12/05/17 1522 12/05/17 2112 12/06/17 0447 12/06/17 1342  BP: (!) 162/80 (!) 151/78 (!) 150/73 138/78  Pulse: 81 83 81 (!) 54  Resp:  18 20   Temp:   98.6 F (37 C) 98.1 F (36.7 C)  TempSrc: Oral  Oral Oral  SpO2: 98% 95% 94% 97%  Weight:   107.8 kg   Height:        Intake/Output Summary (Last 24 hours) at 12/06/2017 1656 Last data filed at 12/06/2017 1206 Gross per 24 hour  Intake 720 ml  Output 2150 ml  Net -1430 ml   Filed Weights   12/04/17 0954 12/04/17 1732 12/06/17 0447  Weight: 111.4 kg 109.8 kg 107.8 kg    Exam:   General: NAD  Cardiovascular: S1, S2 present  Respiratory: Mild bibasilar crackles noted  Abdomen: Soft, nontender, nondistended, bowel sounds present  Musculoskeletal: 2+ pitting edema bilaterally  Skin: Normal  Psychiatry: Normal mood   Data Reviewed: CBC: Recent Labs  Lab 12/04/17 1107 12/05/17 0220 12/06/17 0347  WBC 4.1 5.1 3.6*  NEUTROABS 2.6  --  2.0  HGB 11.3* 11.5* 11.4*  HCT 33.5* 35.5* 35.3*  MCV 81.5 84.7 84.9  PLT 270 261 740   Basic Metabolic Panel: Recent Labs  Lab 12/04/17  1107 12/05/17 0220 12/06/17 0347  NA 139 139 138  K 3.7 3.6 3.9  CL 105 104 101  CO2 26 26 28   GLUCOSE 124* 139* 187*  BUN 23* 19 23*  CREATININE 1.90* 1.93* 2.09*  CALCIUM 8.6* 8.5* 8.6*  MG  --  1.9  --   PHOS  --   --  3.8   GFR: Estimated Creatinine Clearance: 48.3 mL/min (A) (by C-G formula based on SCr of 2.09 mg/dL (H)). Liver Function Tests: Recent Labs  Lab 12/04/17 1107 12/06/17 0347  AST 26  --   ALT 21  --   ALKPHOS 77   --   BILITOT 0.9  --   PROT 6.5  --   ALBUMIN 3.0* 2.8*   No results for input(s): LIPASE, AMYLASE in the last 168 hours. No results for input(s): AMMONIA in the last 168 hours. Coagulation Profile: No results for input(s): INR, PROTIME in the last 168 hours. Cardiac Enzymes: Recent Labs  Lab 12/04/17 1107 12/04/17 2003 12/05/17 0220  TROPONINI 0.06* 0.04* 0.04*   BNP (last 3 results) No results for input(s): PROBNP in the last 8760 hours. HbA1C: No results for input(s): HGBA1C in the last 72 hours. CBG: Recent Labs  Lab 12/05/17 1622 12/05/17 2111 12/06/17 0749 12/06/17 1101 12/06/17 1608  GLUCAP 182* 225* 183* 161* 122*   Lipid Profile: Recent Labs    12/05/17 0220  CHOL 145  HDL 54  LDLCALC 70  TRIG 103  CHOLHDL 2.7   Thyroid Function Tests: No results for input(s): TSH, T4TOTAL, FREET4, T3FREE, THYROIDAB in the last 72 hours. Anemia Panel: No results for input(s): VITAMINB12, FOLATE, FERRITIN, TIBC, IRON, RETICCTPCT in the last 72 hours. Urine analysis:    Component Value Date/Time   COLORURINE YELLOW 12/04/2017 1249   APPEARANCEUR CLEAR 12/04/2017 1249   LABSPEC 1.025 12/04/2017 1249   PHURINE 6.0 12/04/2017 1249   GLUCOSEU NEGATIVE 12/04/2017 1249   HGBUR MODERATE (A) 12/04/2017 1249   BILIRUBINUR NEGATIVE 12/04/2017 1249   KETONESUR NEGATIVE 12/04/2017 1249   PROTEINUR >300 (A) 12/04/2017 1249   NITRITE NEGATIVE 12/04/2017 1249   LEUKOCYTESUR NEGATIVE 12/04/2017 1249   Sepsis Labs: @LABRCNTIP (procalcitonin:4,lacticidven:4)  ) Recent Results (from the past 240 hour(s))  Urine culture     Status: None   Collection Time: 12/04/17 12:49 PM  Result Value Ref Range Status   Specimen Description   Final    URINE, RANDOM Performed at [facility], [address] [place], [place] [address]    Special Requests   Final    NONE Performed at [facility], [address] [place], [place] [address]    Culture   Final    NO  GROWTH Performed at [facility], [place] [address], [place], [place] [address]    Report Status 12/06/2017 FINAL  Final  MRSA PCR Screening     Status: None   Collection Time: 12/04/17  6:09 PM  Result Value Ref Range Status   MRSA by PCR NEGATIVE NEGATIVE Final    Comment:        The GeneXpert MRSA Assay (FDA approved for NASAL specimens only), is one component of a comprehensive MRSA colonization surveillance program. It is not intended to diagnose MRSA infection nor to guide or monitor treatment for MRSA infections. Performed at [facility], [place] [address] [place],  [address]       Studies: No results found.  Scheduled Meds: . amLODipine  10 mg Oral Daily  . feeding supplement (ENSURE ENLIVE)  237 mL Oral BID BM  . FLUoxetine  20 mg Oral QHS  . furosemide  40 mg Intravenous Q12H  . heparin injection (subcutaneous)  5,000 Units Subcutaneous Q8H  . hydrALAZINE  25 mg Oral Q8H  . insulin aspart  0-9 Units Subcutaneous TID WC  . simvastatin  40 mg Oral QHS  . sodium chloride flush  3 mL Intravenous Q12H    Continuous Infusions:   LOS: 2 days     Alma Friendly, MD [facility]   If 7PM-7AM, please contact night-coverage [email or web address] 12/06/2017, 4:56 PM

## 2017-12-06 NOTE — Plan of Care (Signed)
Control BP. Monitor q shift. Control CBG. CBG monitoring achs with SSI.

## 2017-12-07 LAB — BASIC METABOLIC PANEL
ANION GAP: 9 (ref 5–15)
BUN: 26 mg/dL — ABNORMAL HIGH (ref 6–20)
CALCIUM: 8.9 mg/dL (ref 8.9–10.3)
CO2: 28 mmol/L (ref 22–32)
CREATININE: 2.06 mg/dL — AB (ref 0.61–1.24)
Chloride: 100 mmol/L (ref 98–111)
GFR calc Af Amer: 39 mL/min — ABNORMAL LOW (ref >60)
GFR, EST NON AFRICAN AMERICAN: 34 mL/min — AB (ref >60)
GLUCOSE: 105 mg/dL — AB (ref 70–99)
Potassium: 3.6 mmol/L (ref 3.5–5.1)
Sodium: 137 mmol/L (ref 135–145)

## 2017-12-07 LAB — UREA NITROGEN, URINE: UREA NITROGEN UR: 197 mg/dL

## 2017-12-07 LAB — GLUCOSE, CAPILLARY
GLUCOSE-CAPILLARY: 204 mg/dL — AB (ref 70–99)
Glucose-Capillary: 110 mg/dL — ABNORMAL HIGH (ref 70–99)
Glucose-Capillary: 211 mg/dL — ABNORMAL HIGH (ref 70–99)
Glucose-Capillary: 170 mg/dL — ABNORMAL HIGH (ref 70–99)

## 2017-12-07 MED ORDER — HYDRALAZINE HCL 50 MG PO TABS
50.0000 mg | ORAL_TABLET | Freq: Three times a day (TID) | ORAL | Status: DC
  Administered 2017-12-07 – 2017-12-09 (×6): 50 mg via ORAL
  Filled 2017-12-07 (×6): qty 1

## 2017-12-07 NOTE — Progress Notes (Signed)
PROGRESS NOTE  Troy Becker YQM:[id number] DOB: Dec 16, 1960 DOA: 12/04/2017 PCP: Patient, No Pcp Per  HPI/Recap of past 24 hours: HPI from Dr Acquanetta Chain is a 57 y.o. male with medical history significant of hypertension, diabetes, hyperlipidemia presents with progressive shortness of breath with exertion, BLE edema, weight gain (about 21 pounds) and fatigue for the past couple of weeks. In the ED, pt was noted to have trop 0.06, BNP 64.5, UA with protein, creatinine 1.9 (1.56 on 05/2017). Patient was noted to desat to 86% on room air by EDP and subsequently placed on supplemental O2.  CXR showed low lung volumes, mild pulmonary edema. Pt admitted for further management.   Today, pt reported feeling much better. No new complaints  Assessment/Plan: Principal Problem:   Volume overload Active Problems:   Diabetes mellitus type 2 with complications (HCC)   Hyperlipidemia associated with type 2 diabetes mellitus (HCC)   Anxiety   Hypertension associated with diabetes ([place])   AKI (acute kidney injury) ([place])   Acute diastolic HF BNP 52.8, 2+ BLE edema Chest x-ray with mild pulmonary edema Echo shows EF of 65-70%, Grade 1DD, severe LVH Increased IV Lasix to 40 mg BID for further diuresis (-5 L since admission, 6 pounds loss) Strict I's and O's, daily weight Daily BMP  AKI on CKD stage III Creatinine baseline around 1.5, on admission 1.9 ?Cardiorenal Avoid nephrotoxic's, increased Lasix for now (discussed with Nephrology), if significant rise in Cr, will reduce lasix dose Daily BMP  Elevated troponin Flat trend, EKG with sinus rhythm Likely due to demand ischemia, from pulmonary edema Echo as above  Diabetes mellitus type 2 Uncontrolled, last A1c on 10/2017 >15% SSI, Accu-Cheks Hold home Tyler Aas PCP to follow-up with a repeat A1c  Hypertension Continue home amlodipine, added hydralazine Discontinue home hydrochlorothiazide due to CKD  Hyperlipidemia LDL  70 Continue statins  Anxiety Continue home fluoxetine    Code Status: Full  Family Communication: None at bedside  Disposition Plan: Once work-up complete, plan for 12/08/17, once I see a downward trend with creatinine and still needs further diuresis   Consultants:  Spoke to Nephrology on 12/06/17  Procedures:  None  Antimicrobials:  None  DVT prophylaxis: Heparin   Objective: Vitals:   12/06/17 1342 12/06/17 1953 12/07/17 0546 12/07/17 0804  BP: 138/78 (!) 145/78 (!) 143/81 (!) 154/82  Pulse: (!) 54 79 69 81  Resp:  16 13   Temp: 98.1 F (36.7 C) 98.4 F (36.9 C) 98.4 F (36.9 C)   TempSrc: Oral Oral Oral   SpO2: 97% 97% 97% 100%  Weight:   106.3 kg   Height:        Intake/Output Summary (Last 24 hours) at 12/07/2017 1218 Last data filed at 12/07/2017 0932 Gross per 24 hour  Intake 240 ml  Output 2755 ml  Net -2515 ml   Filed Weights   12/04/17 1732 12/06/17 0447 12/07/17 0546  Weight: 109.8 kg 107.8 kg 106.3 kg    Exam:   General: NAD  Cardiovascular: S1, S2 present  Respiratory: Mild bibasilar crackles noted  Abdomen: Soft, nontender, nondistended, bowel sounds present  Musculoskeletal: 2+ pitting edema bilaterally  Skin: Normal  Psychiatry: Normal mood   Data Reviewed: CBC: Recent Labs  Lab 12/04/17 1107 12/05/17 0220 12/06/17 0347  WBC 4.1 5.1 3.6*  NEUTROABS 2.6  --  2.0  HGB 11.3* 11.5* 11.4*  HCT 33.5* 35.5* 35.3*  MCV 81.5 84.7 84.9  PLT 270 261 251  Basic Metabolic Panel: Recent Labs  Lab 12/04/17 1107 12/05/17 0220 12/06/17 0347 12/07/17 0636  NA 139 139 138 137  K 3.7 3.6 3.9 3.6  CL 105 104 101 100  CO2 26 26 28 28   GLUCOSE 124* 139* 187* 105*  BUN 23* 19 23* 26*  CREATININE 1.90* 1.93* 2.09* 2.06*  CALCIUM 8.6* 8.5* 8.6* 8.9  MG  --  1.9  --   --   PHOS  --   --  3.8  --    GFR: Estimated Creatinine Clearance: 48.7 mL/min (A) (by C-G formula based on SCr of 2.06 mg/dL (H)). Liver Function  Tests: Recent Labs  Lab 12/04/17 1107 12/06/17 0347  AST 26  --   ALT 21  --   ALKPHOS 77  --   BILITOT 0.9  --   PROT 6.5  --   ALBUMIN 3.0* 2.8*   No results for input(s): LIPASE, AMYLASE in the last 168 hours. No results for input(s): AMMONIA in the last 168 hours. Coagulation Profile: No results for input(s): INR, PROTIME in the last 168 hours. Cardiac Enzymes: Recent Labs  Lab 12/04/17 1107 12/04/17 2003 12/05/17 0220  TROPONINI 0.06* 0.04* 0.04*   BNP (last 3 results) No results for input(s): PROBNP in the last 8760 hours. HbA1C: No results for input(s): HGBA1C in the last 72 hours. CBG: Recent Labs  Lab 12/06/17 1608 12/06/17 2010 12/06/17 2144 12/07/17 0752 12/07/17 1132  GLUCAP 122* 98 122* 110* 211*   Lipid Profile: Recent Labs    12/05/17 0220  CHOL 145  HDL 54  LDLCALC 70  TRIG 103  CHOLHDL 2.7   Thyroid Function Tests: No results for input(s): TSH, T4TOTAL, FREET4, T3FREE, THYROIDAB in the last 72 hours. Anemia Panel: No results for input(s): VITAMINB12, FOLATE, FERRITIN, TIBC, IRON, RETICCTPCT in the last 72 hours. Urine analysis:    Component Value Date/Time   COLORURINE YELLOW 12/04/2017 1249   APPEARANCEUR CLEAR 12/04/2017 1249   LABSPEC 1.025 12/04/2017 1249   PHURINE 6.0 12/04/2017 1249   GLUCOSEU NEGATIVE 12/04/2017 1249   HGBUR MODERATE (A) 12/04/2017 1249   BILIRUBINUR NEGATIVE 12/04/2017 1249   KETONESUR NEGATIVE 12/04/2017 1249   PROTEINUR >300 (A) 12/04/2017 1249   NITRITE NEGATIVE 12/04/2017 1249   LEUKOCYTESUR NEGATIVE 12/04/2017 1249   Sepsis Labs: @LABRCNTIP (procalcitonin:4,lacticidven:4)  ) Recent Results (from the past 240 hour(s))  Urine culture     Status: None   Collection Time: 12/04/17 12:49 PM  Result Value Ref Range Status   Specimen Description   Final    URINE, RANDOM Performed at [facility], [address] [place], [place] [address]    Special Requests   Final    NONE Performed at  [facility], [address] [place], [place] [address]    Culture   Final    NO GROWTH Performed at [facility], [place] [address] [place],  [address]    Report Status 12/06/2017 FINAL  Final  MRSA PCR Screening     Status: None   Collection Time: 12/04/17  6:09 PM  Result Value Ref Range Status   MRSA by PCR NEGATIVE NEGATIVE Final    Comment:        The GeneXpert MRSA Assay (FDA approved for NASAL specimens only), is one component of a comprehensive MRSA colonization surveillance program. It is not intended to diagnose MRSA infection nor to guide or monitor treatment for MRSA infections. Performed at [facility], [address], [place], [place] [address]       Studies: No results found.  Scheduled Meds: . amLODipine  10 mg Oral Daily  . feeding supplement (ENSURE ENLIVE)  237 mL Oral BID BM  . FLUoxetine  20 mg Oral QHS  . furosemide  40 mg Intravenous Q12H  . heparin injection (subcutaneous)  5,000 Units Subcutaneous Q8H  . hydrALAZINE  50 mg Oral Q8H  . insulin aspart  0-15 Units Subcutaneous TID WC  . insulin aspart  0-5 Units Subcutaneous QHS  . simvastatin  40 mg Oral QHS  . sodium chloride flush  3 mL Intravenous Q12H    Continuous Infusions:   LOS: 3 days     Alma Friendly, MD [facility]   If 7PM-7AM, please contact night-coverage [email or web address] 12/07/2017, 12:18 PM

## 2017-12-08 LAB — BASIC METABOLIC PANEL
ANION GAP: 11 (ref 5–15)
BUN: 30 mg/dL — ABNORMAL HIGH (ref 6–20)
CHLORIDE: 97 mmol/L — AB (ref 98–111)
CO2: 28 mmol/L (ref 22–32)
Calcium: 8.5 mg/dL — ABNORMAL LOW (ref 8.9–10.3)
Creatinine, Ser: 1.99 mg/dL — ABNORMAL HIGH (ref 0.61–1.24)
GFR calc Af Amer: 41 mL/min — ABNORMAL LOW (ref >60)
GFR, EST NON AFRICAN AMERICAN: 35 mL/min — AB (ref >60)
Glucose, Bld: 142 mg/dL — ABNORMAL HIGH (ref 70–99)
POTASSIUM: 3.5 mmol/L (ref 3.5–5.1)
SODIUM: 136 mmol/L (ref 135–145)

## 2017-12-08 LAB — GLUCOSE, CAPILLARY
GLUCOSE-CAPILLARY: 176 mg/dL — AB (ref 70–99)
GLUCOSE-CAPILLARY: 168 mg/dL — AB (ref 70–99)
Glucose-Capillary: 127 mg/dL — ABNORMAL HIGH (ref 70–99)
Glucose-Capillary: 228 mg/dL — ABNORMAL HIGH (ref 70–99)

## 2017-12-08 NOTE — Progress Notes (Addendum)
PROGRESS NOTE  JAESON MOLSTAD BOF:[id number] DOB: 09-Jun-1960 DOA: 12/04/2017 PCP: Patient, No Pcp Per  HPI/Recap of past 24 hours: HPI from Dr Acquanetta Chain is a 57 y.o. male with medical history significant of hypertension, diabetes, hyperlipidemia presents with progressive shortness of breath with exertion, BLE edema, weight gain (about 21 pounds) and fatigue for the past couple of weeks. In the ED, pt was noted to have trop 0.06, BNP 64.5, UA with protein, creatinine 1.9 (1.56 on 05/2017). Patient was noted to desat to 86% on room air by EDP and subsequently placed on supplemental O2.  CXR showed low lung volumes, mild pulmonary edema. Pt admitted for further management.   Today, pt reported feeling much better. No new complaints. Still needs to be further diuresed with IV lasix.  Assessment/Plan: Principal Problem:   Volume overload Active Problems:   Diabetes mellitus type 2 with complications (HCC)   Hyperlipidemia associated with type 2 diabetes mellitus (HCC)   Anxiety   Hypertension associated with diabetes ([place])   AKI (acute kidney injury) ([place])   Acute diastolic HF BNP 77.8, 2+ BLE edema Chest x-ray with mild pulmonary edema Echo shows EF of 65-70%, Grade 1DD, severe LVH Increased IV Lasix to 40 mg BID for further diuresis (-6 L since admission, 10 pounds loss) Strict I's and O's, daily weight Daily BMP  AKI on CKD stage III Creatinine baseline around 1.5, on admission 1.9 ?Cardiorenal Avoid nephrotoxic's, increased Lasix for now (discussed with Nephrology), if significant rise in Cr, will reduce lasix dose Daily BMP  Elevated troponin Flat trend, EKG with sinus rhythm Likely due to demand ischemia, from pulmonary edema Echo as above  Diabetes mellitus type 2 Uncontrolled, last A1c on 10/2017 >15% SSI, Accu-Cheks Hold home Tyler Aas PCP to follow-up with a repeat A1c  Hypertension Continue home amlodipine, added hydralazine Discontinue home  hydrochlorothiazide due to CKD  Hyperlipidemia LDL 70 Continue statins  Anxiety Continue home fluoxetine    Code Status: Full  Family Communication: Spoke to patient's brother who is a psychiatrist with permission from patient    Disposition Plan: Once work-up complete, plan for 12/09/17, once I see a significant downward trend with creatinine, still needs further diuresis due to significant BLE edema   Consultants:  Spoke to Nephrology on 12/06/17  Procedures:  None  Antimicrobials:  None  DVT prophylaxis: Heparin   Objective: Vitals:   12/07/17 1618 12/07/17 2050 12/08/17 0608 12/08/17 1143  BP: (!) 150/74 (!) 158/81 138/77 (!) 156/81  Pulse: 75 75 73 73  Resp: 19 (!) 22 17 18   Temp:  97.9 F (36.6 C) 98.8 F (37.1 C) 98 F (36.7 C)  TempSrc:  Oral Oral Oral  SpO2: 99% 100% 98% 98%  Weight:   103.9 kg   Height:        Intake/Output Summary (Last 24 hours) at 12/08/2017 1232 Last data filed at 12/08/2017 0900 Gross per 24 hour  Intake 840 ml  Output 2580 ml  Net -1740 ml   Filed Weights   12/06/17 0447 12/07/17 0546 12/08/17 0608  Weight: 107.8 kg 106.3 kg 103.9 kg    Exam:   General: NAD  Cardiovascular: S1, S2 present  Respiratory: CTAB  Abdomen: Soft, nontender, nondistended, bowel sounds present  Musculoskeletal: 2+ pitting edema bilaterally  Skin: Normal  Psychiatry: Normal mood   Data Reviewed: CBC: Recent Labs  Lab 12/04/17 1107 12/05/17 0220 12/06/17 0347  WBC 4.1 5.1 3.6*  NEUTROABS 2.6  --  2.0  HGB 11.3* 11.5* 11.4*  HCT 33.5* 35.5* 35.3*  MCV 81.5 84.7 84.9  PLT 270 261 478   Basic Metabolic Panel: Recent Labs  Lab 12/04/17 1107 12/05/17 0220 12/06/17 0347 12/07/17 0636 12/08/17 0442  NA 139 139 138 137 136  K 3.7 3.6 3.9 3.6 3.5  CL 105 104 101 100 97*  CO2 26 26 28 28 28   GLUCOSE 124* 139* 187* 105* 142*  BUN 23* 19 23* 26* 30*  CREATININE 1.90* 1.93* 2.09* 2.06* 1.99*  CALCIUM 8.6* 8.5* 8.6* 8.9  8.5*  MG  --  1.9  --   --   --   PHOS  --   --  3.8  --   --    GFR: Estimated Creatinine Clearance: 49.8 mL/min (A) (by C-G formula based on SCr of 1.99 mg/dL (H)). Liver Function Tests: Recent Labs  Lab 12/04/17 1107 12/06/17 0347  AST 26  --   ALT 21  --   ALKPHOS 77  --   BILITOT 0.9  --   PROT 6.5  --   ALBUMIN 3.0* 2.8*   No results for input(s): LIPASE, AMYLASE in the last 168 hours. No results for input(s): AMMONIA in the last 168 hours. Coagulation Profile: No results for input(s): INR, PROTIME in the last 168 hours. Cardiac Enzymes: Recent Labs  Lab 12/04/17 1107 12/04/17 2003 12/05/17 0220  TROPONINI 0.06* 0.04* 0.04*   BNP (last 3 results) No results for input(s): PROBNP in the last 8760 hours. HbA1C: No results for input(s): HGBA1C in the last 72 hours. CBG: Recent Labs  Lab 12/07/17 1132 12/07/17 1618 12/07/17 2146 12/08/17 0804 12/08/17 1141  GLUCAP 211* 204* 170* 127* 176*   Lipid Profile: No results for input(s): CHOL, HDL, LDLCALC, TRIG, CHOLHDL, LDLDIRECT in the last 72 hours. Thyroid Function Tests: No results for input(s): TSH, T4TOTAL, FREET4, T3FREE, THYROIDAB in the last 72 hours. Anemia Panel: No results for input(s): VITAMINB12, FOLATE, FERRITIN, TIBC, IRON, RETICCTPCT in the last 72 hours. Urine analysis:    Component Value Date/Time   COLORURINE YELLOW 12/04/2017 1249   APPEARANCEUR CLEAR 12/04/2017 1249   LABSPEC 1.025 12/04/2017 1249   PHURINE 6.0 12/04/2017 1249   GLUCOSEU NEGATIVE 12/04/2017 1249   HGBUR MODERATE (A) 12/04/2017 1249   BILIRUBINUR NEGATIVE 12/04/2017 1249   KETONESUR NEGATIVE 12/04/2017 1249   PROTEINUR >300 (A) 12/04/2017 1249   NITRITE NEGATIVE 12/04/2017 1249   LEUKOCYTESUR NEGATIVE 12/04/2017 1249   Sepsis Labs: @LABRCNTIP (procalcitonin:4,lacticidven:4)  ) Recent Results (from the past 240 hour(s))  Urine culture     Status: None   Collection Time: 12/04/17 12:49 PM  Result Value Ref Range  Status   Specimen Description   Final    URINE, RANDOM Performed at [facility], [address] [place], [place] [address]    Special Requests   Final    NONE Performed at [facility], [address] [place], [place] [address]    Culture   Final    NO GROWTH Performed at [facility], [place] [address] [place], [place] [address]    Report Status 12/06/2017 FINAL  Final  MRSA PCR Screening     Status: None   Collection Time: 12/04/17  6:09 PM  Result Value Ref Range Status   MRSA by PCR NEGATIVE NEGATIVE Final    Comment:        The GeneXpert MRSA Assay (FDA approved for NASAL specimens only), is one component of  a comprehensive MRSA colonization surveillance program. It is not intended to diagnose MRSA infection nor to guide or monitor treatment for MRSA infections. Performed at [facility], [place] [address], [place], [place] [address]       Studies: No results found.  Scheduled Meds: . amLODipine  10 mg Oral Daily  . feeding supplement (ENSURE ENLIVE)  237 mL Oral BID BM  . FLUoxetine  20 mg Oral QHS  . furosemide  40 mg Intravenous Q12H  . heparin injection (subcutaneous)  5,000 Units Subcutaneous Q8H  . hydrALAZINE  50 mg Oral Q8H  . insulin aspart  0-15 Units Subcutaneous TID WC  . insulin aspart  0-5 Units Subcutaneous QHS  . simvastatin  40 mg Oral QHS  . sodium chloride flush  3 mL Intravenous Q12H    Continuous Infusions:   LOS: 4 days     Alma Friendly, MD [facility]   If 7PM-7AM, please contact night-coverage [email or web address] 12/08/2017, 12:32 PM

## 2017-12-09 LAB — BASIC METABOLIC PANEL
Anion gap: 8 (ref 5–15)
BUN: 29 mg/dL — ABNORMAL HIGH (ref 6–20)
CALCIUM: 8.3 mg/dL — AB (ref 8.9–10.3)
CO2: 28 mmol/L (ref 22–32)
CREATININE: 2.05 mg/dL — AB (ref 0.61–1.24)
Chloride: 99 mmol/L (ref 98–111)
GFR, EST AFRICAN AMERICAN: 39 mL/min — AB (ref >60)
GFR, EST NON AFRICAN AMERICAN: 34 mL/min — AB (ref >60)
Glucose, Bld: 214 mg/dL — ABNORMAL HIGH (ref 70–99)
Potassium: 3.4 mmol/L — ABNORMAL LOW (ref 3.5–5.1)
SODIUM: 135 mmol/L (ref 135–145)

## 2017-12-09 LAB — GLUCOSE, CAPILLARY
GLUCOSE-CAPILLARY: 179 mg/dL — AB (ref 70–99)
Glucose-Capillary: 195 mg/dL — ABNORMAL HIGH (ref 70–99)

## 2017-12-09 MED ORDER — HYDRALAZINE HCL 50 MG PO TABS: 75.0000 mg | ORAL_TABLET | Freq: Two times a day (BID) | ORAL | 0 refills | Status: AC

## 2017-12-09 MED ORDER — FLUOXETINE HCL 20 MG PO CAPS: 20.0000 mg | ORAL_CAPSULE | Freq: Every day | ORAL | 0 refills | Status: AC

## 2017-12-09 MED ORDER — AMLODIPINE BESYLATE 10 MG PO TABS: 10.0000 mg | ORAL_TABLET | Freq: Every day | ORAL | 0 refills | Status: AC

## 2017-12-09 MED ORDER — POTASSIUM CHLORIDE CRYS ER 20 MEQ PO TBCR
40.0000 meq | EXTENDED_RELEASE_TABLET | Freq: Once | ORAL | Status: AC
  Administered 2017-12-09: 40 meq via ORAL
  Filled 2017-12-09: qty 2

## 2017-12-09 MED ORDER — FUROSEMIDE 40 MG PO TABS: 40.0000 mg | ORAL_TABLET | Freq: Every day | ORAL | 0 refills | Status: AC

## 2017-12-09 NOTE — Discharge Summary (Signed)
Discharge Summary  Troy Becker YBO:[id number] DOB: 1960-06-28  PCP: Troy Becker, No Pcp Per  Admit date: 12/04/2017 Discharge date: 12/09/2017  Time spent: 40 mins  Recommendations for Outpatient Follow-up:  1. PCP to establish care and make an appt in 1 week 2. Follow up with his endocrinologist  Discharge Diagnoses:  Active Hospital Problems   Diagnosis Date Noted  . Volume overload 12/04/2017  . AKI (acute kidney injury) ([place]) 12/04/2017  . Anxiety 07/21/2009  . Diabetes mellitus type 2 with complications ([place]) 27/78/2423  . Hyperlipidemia associated with type 2 diabetes mellitus ([place]) 10/23/2006  . Hypertension associated with diabetes ([place]) 07/23/2006    Resolved Hospital Problems  No resolved problems to display.    Discharge Condition: Stable  Diet recommendation: Heart healthy, low sodium, mod carb  Vitals:   12/08/17 2058 12/09/17 0458  BP: (!) 161/75 138/77  Pulse: 80 68  Resp:    Temp: 98.3 F (36.8 C) 98.5 F (36.9 C)  SpO2: 98% 96%    History of present illness:  Troy Becker a 57 y.o.malewith medical history significant ofhypertension, diabetes, hyperlipidemia presents with progressive shortness of breath with exertion, BLE edema, weight gain (about 21 pounds) and fatigue for the past couple of weeks. In the ED, pt was noted to have trop 0.06, BNP 64.5,UA with protein, creatinine 1.9 (1.56 on 05/2017). Troy Becker was noted to desat to 86% on room air by EDP and subsequently placed on supplemental O2. CXRshowed low lung volumes, mild pulmonary edema. Pt admitted for further management.  Today, pt denies any new complaints. Stable for d/c home. Stressed the importance of medication adherence, following up with appointments and lifestyle modification.  Hospital Course:  Principal Problem:   Volume overload Active Problems:   Diabetes mellitus type 2 with complications (HCC)   Hyperlipidemia associated with type 2 diabetes mellitus (HCC)  Anxiety   Hypertension associated with diabetes ([place])   AKI (acute kidney injury) ([place])  Acute diastolic HF BNP 53.6,  3+ BLE edema on presentation Chest x-ray with mild pulmonary edema Echo shows EF of 65-70%, Grade 1DD, severe LVH S/P IV Lasix to 40 mg BID for further diuresis (- 7.9 L since admission, 15 pounds loss) D/C pt on PO lasix 40 mg daily with close monitoring of BMP, may adjust dose pending renal fxn as pt has CKD  AKI on CKD stage III Creatinine baseline around 1.5 ?Cardiorenal Normal renal USS Follow up with PCP, may need a nephrologist to manage likely diabetic nephropathy   Elevated troponin Flat trend, EKG with sinus rhythm Likely due to demand ischemia, from pulmonary edema Echo as above  Diabetes mellitus type 2 Uncontrolled, last A1c on 10/2017 >15% Continue home [place] Endocrinologist/PCP to follow-up with a repeat A1c  Hypertension Continue home amlodipine, started on hydralazine 75 mg BID Discontinue home hydrochlorothiazide due to CKD  Hyperlipidemia LDL 70 Continue statins  Anxiety Continue home fluoxetine    Procedures:  None   Consultations:  None  Discharge Exam: BP 138/77 (BP Location: Left Arm)   Pulse 68   Temp 98.5 F (36.9 C) (Oral)   Resp 18   Ht 6' (1.829 m)   Wt 102.1 kg   SpO2 96%   BMI 30.52 kg/m   General: NAD Cardiovascular: S1, S2 present Respiratory: CTAB  Discharge Instructions You were cared for by a hospitalist during your hospital stay. If you have any questions about your discharge medications or the care you received while you were in the hospital after  you are discharged, you can call the unit and asked to speak with the hospitalist on call if the hospitalist that took care of you is not available. Once you are discharged, your primary care physician will handle any further medical issues. Please note that NO REFILLS for any discharge medications will be authorized once you are discharged, as it  is imperative that you return to your primary care physician (or establish a relationship with a primary care physician if you do not have one) for your aftercare needs so that they can reassess your need for medications and monitor your lab values.   Allergies as of 12/09/2017   No Known Allergies     Medication List    STOP taking these medications   docusate sodium 100 MG capsule Commonly known as:  COLACE   HYDROCHLOROTHIAZIDE PO     TAKE these medications   amLODipine 10 MG tablet Commonly known as:  NORVASC Take 1 tablet (10 mg total) by mouth daily.   aspirin 81 MG tablet Take 81 mg by mouth daily.   B-D ULTRAFINE III SHORT PEN 31G X 8 MM Misc Generic drug:  Insulin Pen Needle USE WITH PEN INJECTIONS   FLUoxetine 20 MG capsule Commonly known as:  PROZAC Take 1 capsule (20 mg total) by mouth at bedtime.   furosemide 40 MG tablet Commonly known as:  LASIX Take 1 tablet (40 mg total) by mouth daily.   hydrALAZINE 50 MG tablet Commonly known as:  APRESOLINE Take 1.5 tablets (75 mg total) by mouth every 12 (twelve) hours.   insulin degludec 100 UNIT/ML Sopn FlexTouch Pen Commonly known as:  TRESIBA Inject 0.4 mLs (40 Units total) into the skin daily. And pen needles 1/day.   ONE TOUCH ULTRA 2 w/Device Kit 1 Device by Does not apply route once.   ONETOUCH DELICA LANCETS Misc Use as directed to check blood sugar twice daily dx 250.02   simvastatin 40 MG tablet Commonly known as:  ZOCOR Take 1 tablet (40 mg total) by mouth at bedtime.      No Known Allergies    The results of significant diagnostics from this hospitalization (including imaging, microbiology, ancillary and laboratory) are listed below for reference.    Significant Diagnostic Studies: Dg Chest 2 View  Result Date: 12/04/2017 CLINICAL DATA:  Shortness of breath for 2 weeks. EXAM: CHEST - 2 VIEW COMPARISON:  None. FINDINGS: The cardiac silhouette is partially obscured but is likely  borderline to mildly enlarged in size. The lungs are hypoinflated with central pulmonary vascular congestion, peribronchial cuffing, and mildly increased interstitial markings diffusely. Mild patchy opacity in the lung bases most likely represents atelectasis. There are small pleural effusions. No pneumothorax or acute osseous abnormality is identified. IMPRESSION: Low lung volumes with mild pulmonary edema and small pleural effusions. Electronically Signed   By: Logan Bores M.D.   On: 12/04/2017 10:49   US Renal  Result Date: 12/05/2017 CLINICAL DATA:  Acute kidney injury. EXAM: RENAL / URINARY TRACT ULTRASOUND COMPLETE COMPARISON:  Ultrasound of September 18, 2016. FINDINGS: Right Kidney: Length: 12.7 cm. Echogenicity within normal limits. No mass or hydronephrosis visualized. Left Kidney: Length: 12.5 cm. Echogenicity within normal limits. No mass or hydronephrosis visualized. Bladder: Appears normal for degree of bladder distention. Bilateral pleural effusions are noted. IMPRESSION: Normal renal ultrasound. Bilateral pleural effusions. Electronically Signed   By: Marijo Conception, M.D.   On: 12/05/2017 16:09    Microbiology: Recent Results (from the past 240 hour(s))  Urine culture     Status: None   Collection Time: 12/04/17 12:49 PM  Result Value Ref Range Status   Specimen Description   Final    URINE, RANDOM Performed at [facility], [address] [place], [place] [address]    Special Requests   Final    NONE Performed at [facility], [address] [place], [place] [address]    Culture   Final    NO GROWTH Performed at [facility], [place] [address] [place], [place] [address]    Report Status 12/06/2017 FINAL  Final  MRSA PCR Screening     Status: None   Collection Time: 12/04/17  6:09 PM  Result Value Ref Range Status   MRSA by PCR NEGATIVE NEGATIVE Final    Comment:        The GeneXpert MRSA Assay (FDA approved for NASAL specimens only), is one  component of a comprehensive MRSA colonization surveillance program. It is not intended to diagnose MRSA infection nor to guide or monitor treatment for MRSA infections. Performed at [facility], [place] [address], [place], [place] [address]      Labs: Basic Metabolic Panel: Recent Labs  Lab 12/05/17 0220 12/06/17 0347 12/07/17 0636 12/08/17 0442 12/09/17 0620  NA 139 138 137 136 135  K 3.6 3.9 3.6 3.5 3.4*  CL 104 101 100 97* 99  CO2 '26 28 28 28 28  ' GLUCOSE 139* 187* 105* 142* 214*  BUN 19 23* 26* 30* 29*  CREATININE 1.93* 2.09* 2.06* 1.99* 2.05*  CALCIUM 8.5* 8.6* 8.9 8.5* 8.3*  MG 1.9  --   --   --   --   PHOS  --  3.8  --   --   --    Liver Function Tests: Recent Labs  Lab 12/04/17 1107 12/06/17 0347  AST 26  --   ALT 21  --   ALKPHOS 77  --   BILITOT 0.9  --   PROT 6.5  --   ALBUMIN 3.0* 2.8*   No results for input(s): LIPASE, AMYLASE in the last 168 hours. No results for input(s): AMMONIA in the last 168 hours. CBC: Recent Labs  Lab 12/04/17 1107 12/05/17 0220 12/06/17 0347  WBC 4.1 5.1 3.6*  NEUTROABS 2.6  --  2.0  HGB 11.3* 11.5* 11.4*  HCT 33.5* 35.5* 35.3*  MCV 81.5 84.7 84.9  PLT 270 261 251   Cardiac Enzymes: Recent Labs  Lab 12/04/17 1107 12/04/17 2003 12/05/17 0220  TROPONINI 0.06* 0.04* 0.04*   BNP: BNP (last 3 results) Recent Labs    12/04/17 1107  BNP 64.5    ProBNP (last 3 results) No results for input(s): PROBNP in the last 8760 hours.  CBG: Recent Labs  Lab 12/08/17 1141 12/08/17 1649 12/08/17 2059 12/09/17 0743 12/09/17 1123  GLUCAP 176* 168* 228* 195* 179*       Signed:  Alma Friendly, MD [facility] 12/09/2017, 12:03 PM

## 2017-12-10 ENCOUNTER — Other Ambulatory Visit: Payer: Self-pay | Admitting: Endocrinology

## 2017-12-25 ENCOUNTER — Ambulatory Visit: Payer: Self-pay | Admitting: Endocrinology

## 2017-12-26 ENCOUNTER — Ambulatory Visit: Payer: Managed Care, Other (non HMO) | Admitting: Endocrinology

## 2017-12-26 ENCOUNTER — Ambulatory Visit: Payer: Self-pay | Admitting: Endocrinology

## 2017-12-26 ENCOUNTER — Encounter: Payer: Self-pay | Admitting: Endocrinology

## 2017-12-26 VITALS — BP 178/88 | HR 88 | Ht 72.0 in | Wt 214.0 lb

## 2017-12-26 DIAGNOSIS — Z794 Long term (current) use of insulin: Secondary | ICD-10-CM

## 2017-12-26 DIAGNOSIS — E118 Type 2 diabetes mellitus with unspecified complications: Secondary | ICD-10-CM

## 2017-12-26 LAB — POCT GLYCOSYLATED HEMOGLOBIN (HGB A1C): Hemoglobin A1C: 9.4 % — AB (ref 4.0–5.6)

## 2017-12-26 NOTE — Patient Instructions (Addendum)
Your blood pressure is high today.  Please see your primary care provider soon, to have it rechecked Please increase the tresiba to 40 units daily.  With this insulin, if you miss it in the morning, you can take the full amount later in the day, or even the next day.  On this type of insulin schedule, you should eat meals on a regular schedule.  If a meal is missed or significantly delayed, your blood sugar could go low.  Also, try eating a light snack at bedtime, to avoid low blood sugar in the morning. check your blood sugar twice a day.  vary the time of day when you check, between before the 3 meals, and at bedtime.  also check if you have symptoms of your blood sugar being too high or too low.  please keep a record of the readings and bring it to your next appointment here.  please call us sooner if your blood sugar goes below 70, or if you have a lot of readings over 200.   Please come back for a follow-up appointment in 3 months.

## 2017-12-26 NOTE — Progress Notes (Signed)
Subjective:    Patient ID: Troy Becker, male    DOB: Jun 25, 1960, 57 y.o.   MRN: [id number]  HPI Pt returns for f/u of diabetes mellitus: DM type: Insulin-requiring type 2. Dx'ed: 0626 Complications: renal failure, polyneuropathy, and foot ulcer.  Therapy: insulin since 2010. DKA: never.  Severe hypoglycemia: never.  Pancreatitis: never Other: he declines multiple daily injections; he takes tresiba for dosing flexibility, due to missing insulin He reduced tresiba to 30 units per day, due to lightheadedness.  He has not checked cbg at this time.  CBG's are reported.  He says it varies from 67-180.  It is in general higher as the day goes on.  pt states he feels well in general.  Past Medical History:  Diagnosis Date  . Abnormal EKG 2006   w/ a neg stress test  . Heart murmur   . Hyperlipidemia   . Hypertension   . Osteomyelitis (HCC)    rt big toe  . Type II diabetes mellitus ([place])     Past Surgical History:  Procedure Laterality Date  . AMPUTATION TOE Right 04/08/2016   Procedure: AMPUTATION OF TOE WITH METATARSAL RIGHT FOOT HALLUX;  Surgeon: Landis Martins, DPM;  Location: [facility];  Service: Podiatry;  Laterality: Right;  . CATARACT EXTRACTION W/ INTRAOCULAR LENS  IMPLANT, BILATERAL Bilateral 2018  . MULTIPLE TOOTH EXTRACTIONS      Social History   Socioeconomic History  . Marital status: Married    Spouse name: Not on file  . Number of children: 2  . Years of education: Not on file  . Highest education level: Not on file  Occupational History  . Occupation: out of work    Social Needs  . Financial resource strain: Not on file  . Food insecurity:    Worry: Not on file    Inability: Not on file  . Transportation needs:    Medical: Not on file    Non-medical: Not on file  Tobacco Use  . Smoking status: Never Smoker  . Smokeless tobacco: Never Used  Substance and Sexual Activity  . Alcohol use: Never    Frequency: Never  . Drug use: Never   . Sexual activity: Not Currently  Lifestyle  . Physical activity:    Days per week: Not on file    Minutes per session: Not on file  . Stress: Not on file  Relationships  . Social connections:    Talks on phone: Not on file    Gets together: Not on file    Attends religious service: Not on file    Active member of club or organization: Not on file    Attends meetings of clubs or organizations: Not on file    Relationship status: Not on file  . Intimate partner violence:    Fear of current or ex partner: Not on file    Emotionally abused: Not on file    Physically abused: Not on file    Forced sexual activity: Not on file  Other Topics Concern  . Not on file  Social History Narrative   Household-- pt, wife, son       Current Outpatient Medications on File Prior to Visit  Medication Sig Dispense Refill  . amLODipine (NORVASC) 10 MG tablet Take 1 tablet (10 mg total) by mouth daily. 30 tablet 0  . aspirin 81 MG tablet Take 81 mg by mouth daily.      . B-D ULTRAFINE III SHORT PEN 31G X  8 MM MISC USE WITH PEN INJECTIONS 100 each 0  . Blood Glucose Monitoring Suppl (ONE TOUCH ULTRA 2) W/DEVICE KIT 1 Device by Does not apply route once. 1 each 0  . FLUoxetine (PROZAC) 20 MG capsule Take 1 capsule (20 mg total) by mouth at bedtime. 30 capsule 0  . furosemide (LASIX) 40 MG tablet Take 1 tablet (40 mg total) by mouth daily. 30 tablet 0  . hydrALAZINE (APRESOLINE) 50 MG tablet Take 1.5 tablets (75 mg total) by mouth every 12 (twelve) hours. 90 tablet 0  . insulin degludec (TRESIBA FLEXTOUCH) 100 UNIT/ML SOPN FlexTouch Pen Inject 0.4 mLs (40 Units total) into the skin daily. And pen needles 1/day. 5 pen 11  . ONETOUCH DELICA LANCETS MISC Use as directed to check blood sugar twice daily dx 250.02 100 each 5  . simvastatin (ZOCOR) 40 MG tablet Take 1 tablet (40 mg total) by mouth at bedtime. 90 tablet 0   No current facility-administered medications on file prior to visit.     No Known  Allergies  Family History  Problem Relation Age of Onset  . Coronary artery disease Father        CABG 55  . Hypertension Father   . Hypertension Mother   . Hypertension Sister   . Hypertension Brother   . Diabetes Brother        F M  . Colon cancer Neg Hx   . Prostate cancer Neg Hx     BP (!) 178/88 (BP Location: Right Arm, Patient Position: Sitting, Cuff Size: Large) Comment: did not take his antihypertensive this morning  Pulse 88   Ht 6' (1.829 m)   Wt 214 lb (97.1 kg)   SpO2 95%   BMI 29.02 kg/m    Review of Systems Denies LOC    Objective:   Physical Exam VITAL SIGNS:  See vs page GENERAL: no distress Pulses: foot pulses are intact bilaterally.   MSK: no deformity of the feet or ankles.  CV: 2+ bilat edema of the legs.  Skin: no foot ulcer normal color and temp on the feet and ankles.  Neuro: sensation is intact to touch on the feet and ankles, but severely reduced from normal.    Ext: There is severe bilateral onychomycosis of the toenails.  Right great toe is absent.  Lab Results  Component Value Date   HGBA1C 9.4 (A) 12/26/2017       Assessment & Plan:  HTN: is noted today Insulin-requiring type 2 DM, with renal failure Lightheadedness, new.  Uncertain if this is hypoglycemia.  However, we'll have to increase insulin cautiously.   Patient Instructions  Your blood pressure is high today.  Please see your primary care provider soon, to have it rechecked Please increase the tresiba to 40 units daily.  With this insulin, if you miss it in the morning, you can take the full amount later in the day, or even the next day.  On this type of insulin schedule, you should eat meals on a regular schedule.  If a meal is missed or significantly delayed, your blood sugar could go low.  Also, try eating a light snack at bedtime, to avoid low blood sugar in the morning. check your blood sugar twice a day.  vary the time of day when you check, between before the 3 meals,  and at bedtime.  also check if you have symptoms of your blood sugar being too high or too low.  please keep a record  of the readings and bring it to your next appointment here.  please call us sooner if your blood sugar goes below 70, or if you have a lot of readings over 200.   Please come back for a follow-up appointment in 3 months.

## 2018-04-03 ENCOUNTER — Ambulatory Visit: Payer: Self-pay | Admitting: Endocrinology

## 2018-04-08 ENCOUNTER — Ambulatory Visit: Payer: Self-pay | Admitting: Endocrinology

## 2018-10-19 IMAGING — US US RENAL
1 series · 14 of 25 positions shown · non-contrast
Comparison: None.

CLINICAL DATA: Elevated serum creatinine

EXAM:
RENAL / URINARY TRACT ULTRASOUND COMPLETE

[Series 1: us renal · 0.22mm/px · 14 of 67 slices shown]
[im 1/67]
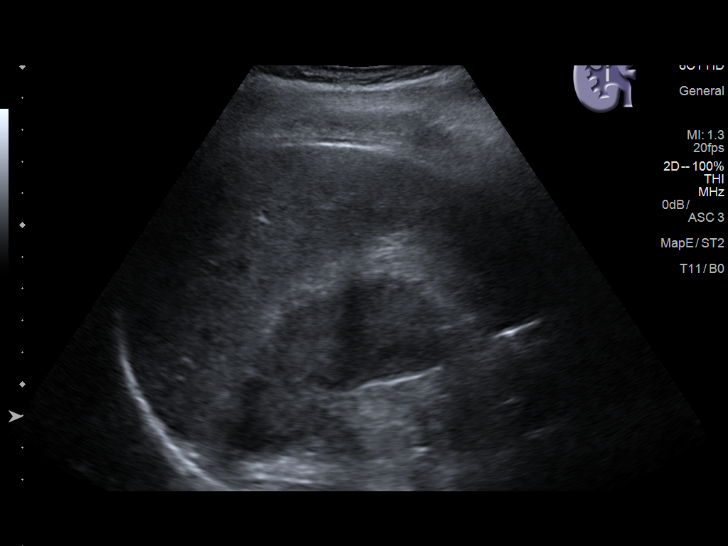
[im 6/67]
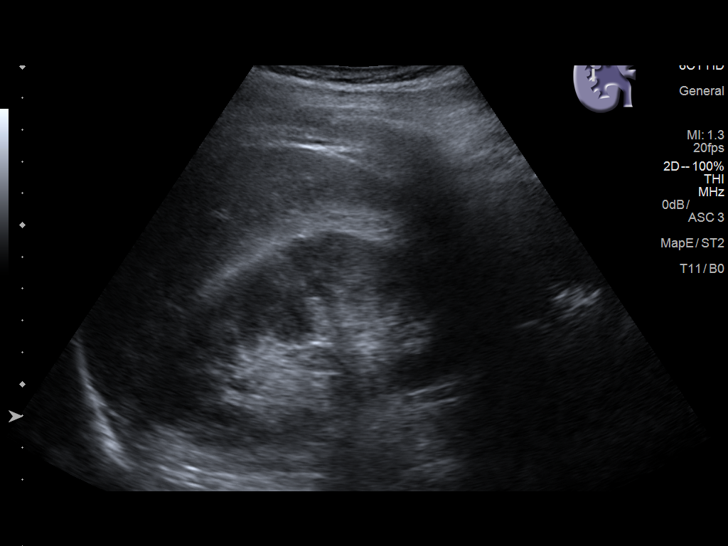
[im 12/67]
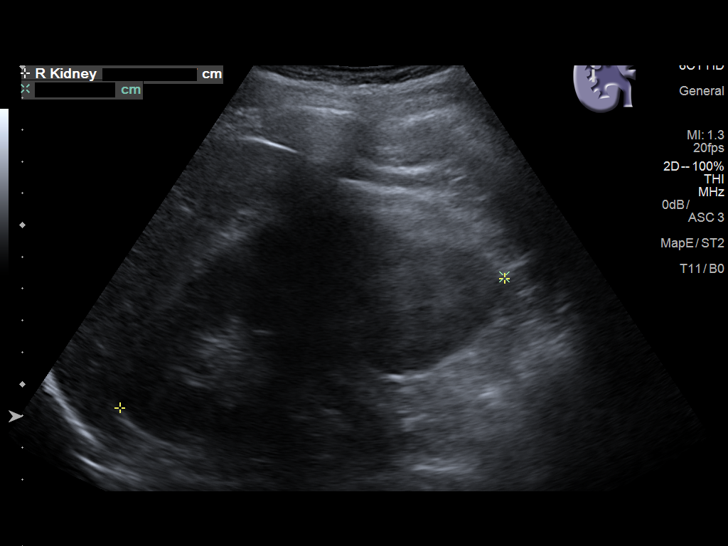
[im 17/67]
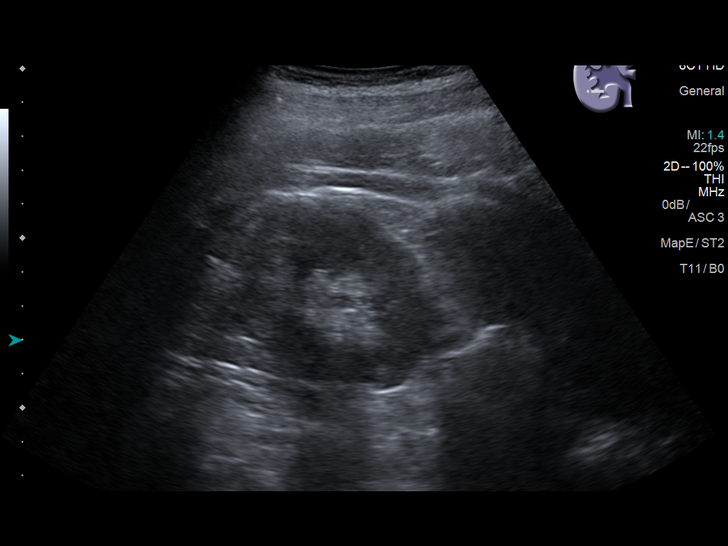
[im 23/67]
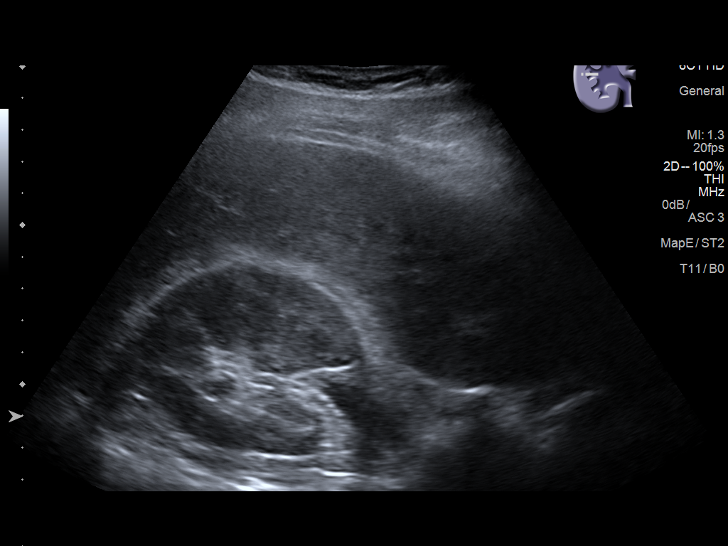
[im 25/67]
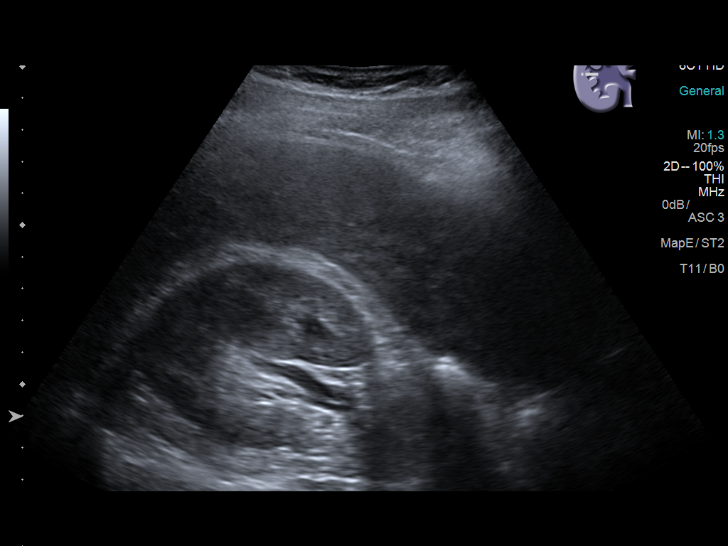
[im 31/67]
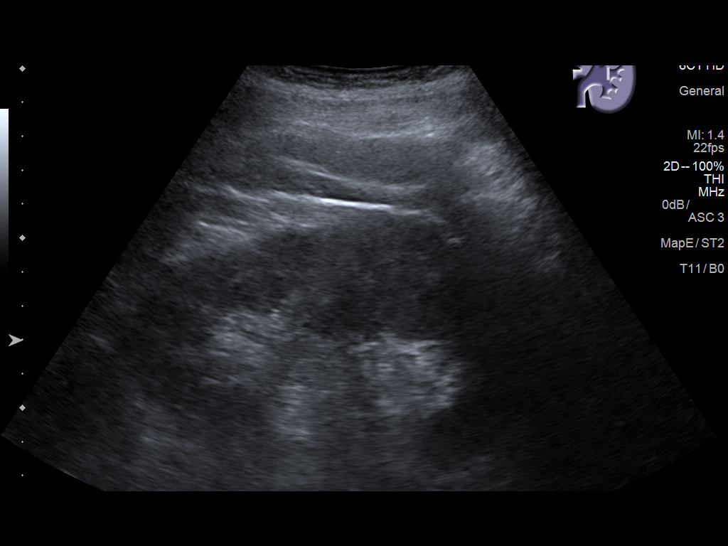
[im 36/67]
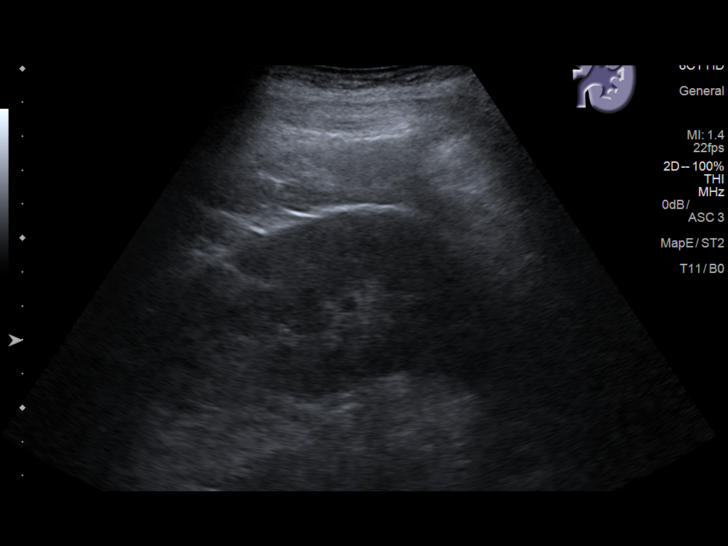
[im 42/67]
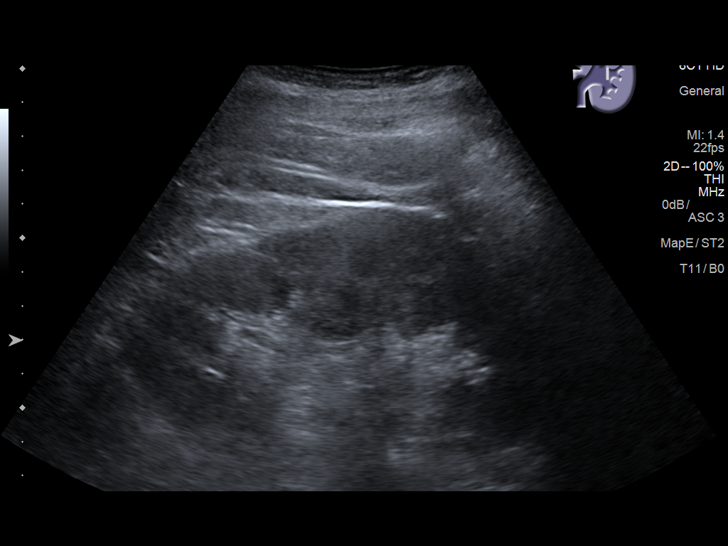
[im 45/67]
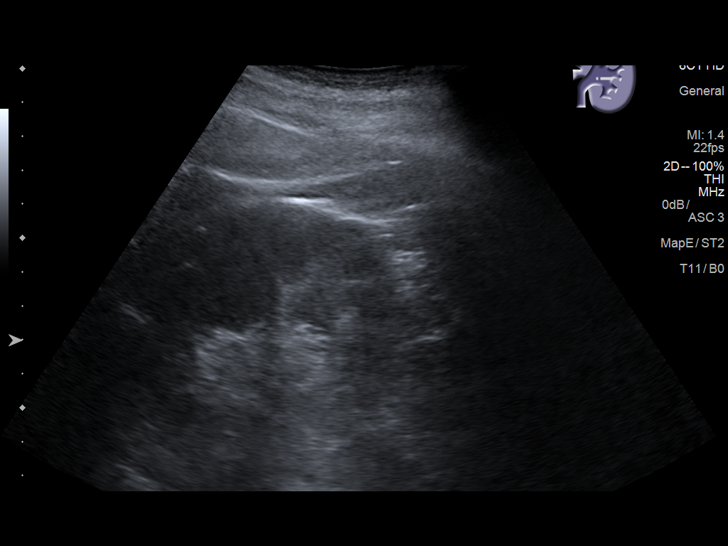
[im 50/67]
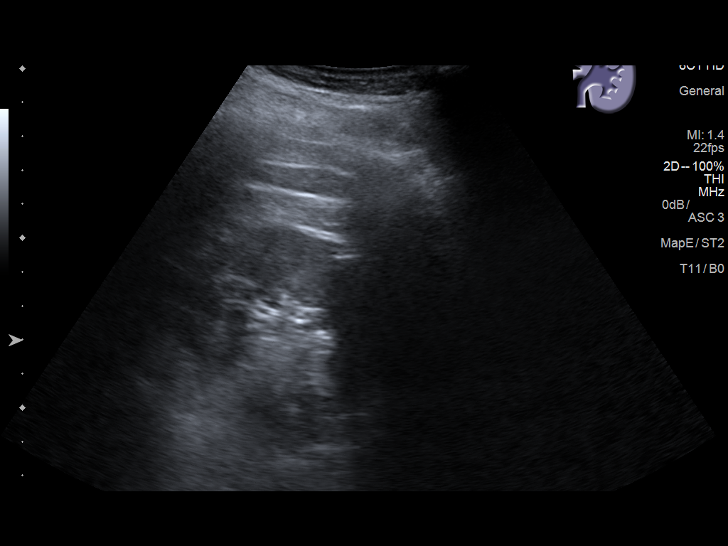
[im 56/67]
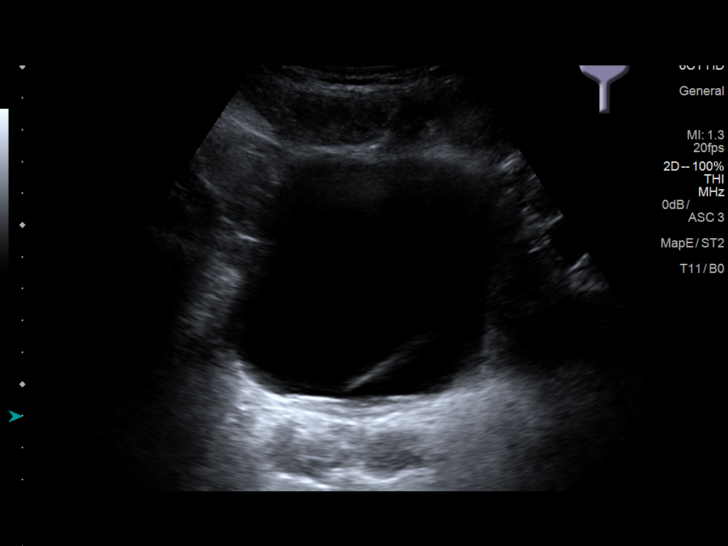
[im 61/67]
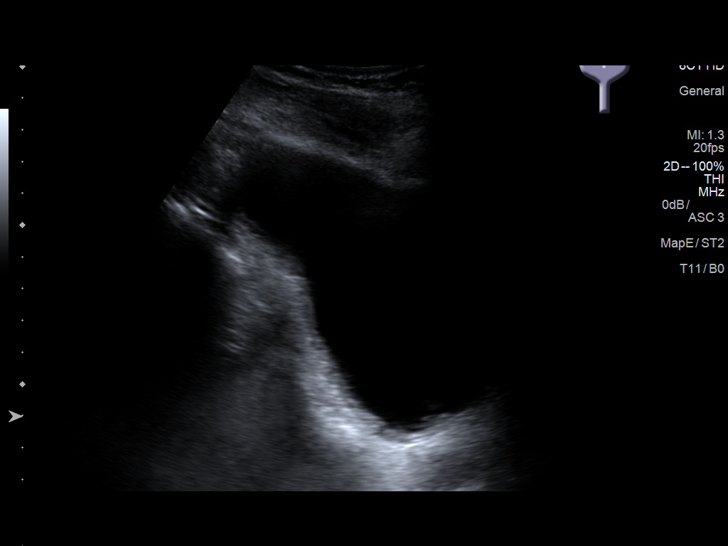
[im 67/67]
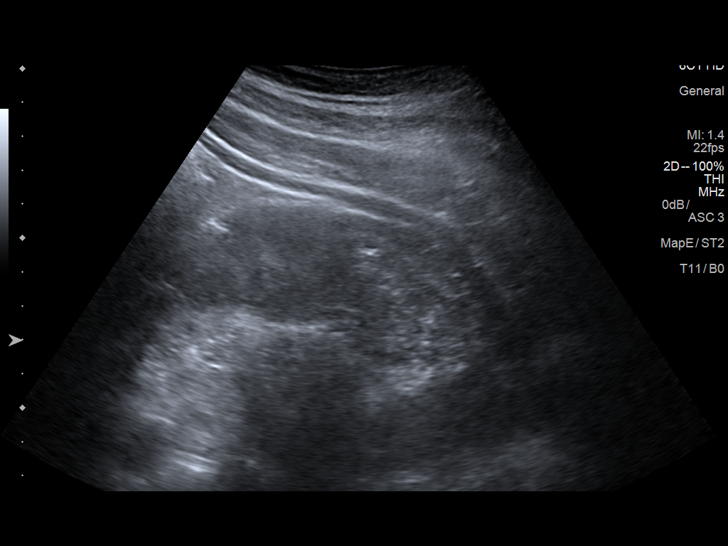

[14 of 25 positions shown; findings below may reference images not displayed]

FINDINGS: Right Kidney:

Length: 12.5 cm. Echogenicity and renal cortical thickness are
within normal limits. No mass, perinephric fluid, or hydronephrosis
visualized. No sonographically demonstrable calculus or
ureterectasis.

Left Kidney:

Length: 12.9 cm. Echogenicity and renal cortical thickness are
within normal limits. No mass, perinephric fluid, or hydronephrosis
visualized. No sonographically demonstrable calculus or
ureterectasis.

Bladder:

Appears normal for degree of bladder distention.
IMPRESSION: Study within normal limits.

## 2019-08-03 DEATH — deceased
